# Patient Record
Sex: Female | Born: 1955 | Race: White | Hispanic: No | Marital: Single | Smoking: Former smoker
Health system: Southern US, Community
[De-identification: ages and names within clinical notes are randomized; demographics above are authoritative.]

## PROBLEM LIST (undated history)

## (undated) DIAGNOSIS — IMO0001 Reserved for inherently not codable concepts without codable children: Secondary | ICD-10-CM

## (undated) DIAGNOSIS — K759 Inflammatory liver disease, unspecified: Secondary | ICD-10-CM

## (undated) DIAGNOSIS — F32A Depression, unspecified: Secondary | ICD-10-CM

## (undated) DIAGNOSIS — M545 Low back pain, unspecified: Secondary | ICD-10-CM

## (undated) DIAGNOSIS — Z9889 Other specified postprocedural states: Secondary | ICD-10-CM

## (undated) DIAGNOSIS — J40 Bronchitis, not specified as acute or chronic: Secondary | ICD-10-CM

## (undated) DIAGNOSIS — M1990 Unspecified osteoarthritis, unspecified site: Secondary | ICD-10-CM

## (undated) DIAGNOSIS — I10 Essential (primary) hypertension: Secondary | ICD-10-CM

## (undated) DIAGNOSIS — T8859XA Other complications of anesthesia, initial encounter: Secondary | ICD-10-CM

## (undated) DIAGNOSIS — R112 Nausea with vomiting, unspecified: Secondary | ICD-10-CM

## (undated) DIAGNOSIS — K219 Gastro-esophageal reflux disease without esophagitis: Secondary | ICD-10-CM

## (undated) DIAGNOSIS — F329 Major depressive disorder, single episode, unspecified: Secondary | ICD-10-CM

## (undated) DIAGNOSIS — N289 Disorder of kidney and ureter, unspecified: Secondary | ICD-10-CM

## (undated) DIAGNOSIS — G8929 Other chronic pain: Secondary | ICD-10-CM

## (undated) DIAGNOSIS — H269 Unspecified cataract: Secondary | ICD-10-CM

## (undated) DIAGNOSIS — T4145XA Adverse effect of unspecified anesthetic, initial encounter: Secondary | ICD-10-CM

## (undated) DIAGNOSIS — L989 Disorder of the skin and subcutaneous tissue, unspecified: Secondary | ICD-10-CM

## (undated) HISTORY — PX: KNEE ARTHROSCOPY: SUR90

## (undated) HISTORY — PX: CARPAL TUNNEL RELEASE: SHX101

## (undated) HISTORY — PX: BACK SURGERY: SHX140

---

## 2001-10-06 ENCOUNTER — Encounter: Payer: Self-pay | Admitting: Cardiology

## 2004-03-24 ENCOUNTER — Ambulatory Visit: Payer: Self-pay | Admitting: Family Medicine

## 2004-06-23 ENCOUNTER — Ambulatory Visit: Payer: Self-pay | Admitting: Family Medicine

## 2005-12-14 ENCOUNTER — Ambulatory Visit: Payer: Self-pay | Admitting: Family Medicine

## 2006-07-04 ENCOUNTER — Ambulatory Visit: Payer: Self-pay | Admitting: Family Medicine

## 2006-11-06 ENCOUNTER — Encounter: Payer: Self-pay | Admitting: Cardiology

## 2008-06-18 ENCOUNTER — Ambulatory Visit: Payer: Self-pay | Admitting: Vascular Surgery

## 2008-07-01 ENCOUNTER — Encounter: Payer: Self-pay | Admitting: Cardiology

## 2008-07-02 ENCOUNTER — Encounter: Payer: Self-pay | Admitting: Cardiology

## 2008-09-17 ENCOUNTER — Ambulatory Visit: Payer: Self-pay | Admitting: Vascular Surgery

## 2008-09-30 ENCOUNTER — Encounter: Payer: Self-pay | Admitting: Cardiology

## 2008-10-16 ENCOUNTER — Encounter: Payer: Self-pay | Admitting: Physician Assistant

## 2008-10-16 ENCOUNTER — Ambulatory Visit: Payer: Self-pay | Admitting: Cardiology

## 2008-10-16 DIAGNOSIS — R079 Chest pain, unspecified: Secondary | ICD-10-CM | POA: Insufficient documentation

## 2008-10-21 ENCOUNTER — Ambulatory Visit: Payer: Self-pay | Admitting: Vascular Surgery

## 2008-10-28 ENCOUNTER — Ambulatory Visit: Payer: Self-pay | Admitting: Vascular Surgery

## 2008-11-04 ENCOUNTER — Ambulatory Visit: Payer: Self-pay | Admitting: Cardiology

## 2008-11-04 ENCOUNTER — Encounter: Payer: Self-pay | Admitting: Cardiology

## 2008-11-08 ENCOUNTER — Encounter (INDEPENDENT_AMBULATORY_CARE_PROVIDER_SITE_OTHER): Payer: Self-pay | Admitting: *Deleted

## 2008-12-03 ENCOUNTER — Encounter: Payer: Self-pay | Admitting: Cardiology

## 2008-12-03 DIAGNOSIS — F17200 Nicotine dependence, unspecified, uncomplicated: Secondary | ICD-10-CM | POA: Insufficient documentation

## 2008-12-03 DIAGNOSIS — R609 Edema, unspecified: Secondary | ICD-10-CM

## 2008-12-03 DIAGNOSIS — E663 Overweight: Secondary | ICD-10-CM | POA: Insufficient documentation

## 2008-12-03 DIAGNOSIS — E119 Type 2 diabetes mellitus without complications: Secondary | ICD-10-CM

## 2008-12-03 DIAGNOSIS — I10 Essential (primary) hypertension: Secondary | ICD-10-CM | POA: Insufficient documentation

## 2008-12-03 DIAGNOSIS — E785 Hyperlipidemia, unspecified: Secondary | ICD-10-CM

## 2008-12-04 ENCOUNTER — Ambulatory Visit: Payer: Self-pay | Admitting: Cardiology

## 2010-02-12 ENCOUNTER — Ambulatory Visit: Payer: Self-pay | Admitting: Internal Medicine

## 2010-02-12 ENCOUNTER — Ambulatory Visit (HOSPITAL_COMMUNITY)
Admission: RE | Admit: 2010-02-12 | Discharge: 2010-02-12 | Payer: Self-pay | Source: Home / Self Care | Attending: Internal Medicine | Admitting: Internal Medicine

## 2010-02-13 ENCOUNTER — Ambulatory Visit (HOSPITAL_COMMUNITY): Admission: RE | Admit: 2010-02-13 | Payer: Self-pay | Admitting: Internal Medicine

## 2010-04-17 ENCOUNTER — Other Ambulatory Visit: Payer: Self-pay | Admitting: Orthopedic Surgery

## 2010-04-17 DIAGNOSIS — M79605 Pain in left leg: Secondary | ICD-10-CM

## 2010-04-17 DIAGNOSIS — I82409 Acute embolism and thrombosis of unspecified deep veins of unspecified lower extremity: Secondary | ICD-10-CM

## 2010-04-20 ENCOUNTER — Ambulatory Visit
Admission: RE | Admit: 2010-04-20 | Discharge: 2010-04-20 | Disposition: A | Discharge status: Home / Self Care | Payer: No Typology Code available for payment source | Source: Ambulatory Visit | Attending: Orthopedic Surgery | Admitting: Orthopedic Surgery

## 2010-04-20 DIAGNOSIS — I82409 Acute embolism and thrombosis of unspecified deep veins of unspecified lower extremity: Secondary | ICD-10-CM

## 2010-04-20 DIAGNOSIS — M79605 Pain in left leg: Secondary | ICD-10-CM

## 2010-05-19 LAB — GLUCOSE, CAPILLARY: Glucose-Capillary: 181 mg/dL — ABNORMAL HIGH (ref 70–99)

## 2010-05-27 ENCOUNTER — Other Ambulatory Visit (HOSPITAL_BASED_OUTPATIENT_CLINIC_OR_DEPARTMENT_OTHER): Payer: Self-pay | Admitting: Orthopaedic Surgery

## 2010-05-27 ENCOUNTER — Encounter (HOSPITAL_COMMUNITY)
Admission: RE | Admit: 2010-05-27 | Discharge: 2010-05-27 | Disposition: A | Discharge status: Home / Self Care | Payer: Medicare Other | Source: Ambulatory Visit | Attending: Orthopaedic Surgery | Admitting: Orthopaedic Surgery

## 2010-05-27 ENCOUNTER — Ambulatory Visit (HOSPITAL_COMMUNITY)
Admission: RE | Admit: 2010-05-27 | Discharge: 2010-05-27 | Disposition: A | Discharge status: Home / Self Care | Payer: Medicare Other | Source: Ambulatory Visit | Attending: Orthopaedic Surgery | Admitting: Orthopaedic Surgery

## 2010-05-27 DIAGNOSIS — M48061 Spinal stenosis, lumbar region without neurogenic claudication: Secondary | ICD-10-CM

## 2010-05-27 DIAGNOSIS — Z01810 Encounter for preprocedural cardiovascular examination: Secondary | ICD-10-CM | POA: Insufficient documentation

## 2010-05-27 DIAGNOSIS — Z01818 Encounter for other preprocedural examination: Secondary | ICD-10-CM | POA: Insufficient documentation

## 2010-05-27 DIAGNOSIS — Z01812 Encounter for preprocedural laboratory examination: Secondary | ICD-10-CM | POA: Insufficient documentation

## 2010-05-27 LAB — DIFFERENTIAL
Basophils Absolute: 0.1 10*3/uL (ref 0.0–0.1)
Basophils Relative: 1 % (ref 0–1)
Eosinophils Relative: 4 % (ref 0–5)
Lymphocytes Relative: 30 % (ref 12–46)
Monocytes Absolute: 0.9 10*3/uL (ref 0.1–1.0)

## 2010-05-27 LAB — CBC
MCHC: 33.1 g/dL (ref 30.0–36.0)
Platelets: 484 10*3/uL — ABNORMAL HIGH (ref 150–400)
RDW: 13.6 % (ref 11.5–15.5)
WBC: 10.8 10*3/uL — ABNORMAL HIGH (ref 4.0–10.5)

## 2010-05-27 LAB — URINALYSIS, ROUTINE W REFLEX MICROSCOPIC
Nitrite: NEGATIVE
Protein, ur: NEGATIVE mg/dL
Specific Gravity, Urine: 1.018 (ref 1.005–1.030)
Urobilinogen, UA: 0.2 mg/dL (ref 0.0–1.0)

## 2010-05-27 LAB — SURGICAL PCR SCREEN
MRSA, PCR: POSITIVE — AB
Staphylococcus aureus: POSITIVE — AB

## 2010-05-27 LAB — COMPREHENSIVE METABOLIC PANEL
Alkaline Phosphatase: 66 U/L (ref 39–117)
BUN: 40 mg/dL — ABNORMAL HIGH (ref 6–23)
Creatinine, Ser: 1.5 mg/dL — ABNORMAL HIGH (ref 0.4–1.2)
Glucose, Bld: 128 mg/dL — ABNORMAL HIGH (ref 70–99)
Potassium: 5 mEq/L (ref 3.5–5.1)
Total Protein: 6.9 g/dL (ref 6.0–8.3)

## 2010-05-27 LAB — PROTIME-INR
INR: 0.91 (ref 0.00–1.49)
Prothrombin Time: 12.5 seconds (ref 11.6–15.2)

## 2010-05-29 ENCOUNTER — Inpatient Hospital Stay (HOSPITAL_COMMUNITY)
Admission: RE | Admit: 2010-05-29 | Discharge: 2010-05-30 | DRG: 491 | Disposition: A | Discharge status: Home / Self Care | Payer: Medicare Other | Source: Ambulatory Visit | Attending: Orthopaedic Surgery | Admitting: Orthopaedic Surgery

## 2010-05-29 ENCOUNTER — Ambulatory Visit (HOSPITAL_COMMUNITY): Payer: Medicare Other

## 2010-05-29 DIAGNOSIS — E669 Obesity, unspecified: Secondary | ICD-10-CM | POA: Diagnosis present

## 2010-05-29 DIAGNOSIS — Z01818 Encounter for other preprocedural examination: Secondary | ICD-10-CM

## 2010-05-29 DIAGNOSIS — M48062 Spinal stenosis, lumbar region with neurogenic claudication: Principal | ICD-10-CM | POA: Diagnosis present

## 2010-05-29 DIAGNOSIS — E119 Type 2 diabetes mellitus without complications: Secondary | ICD-10-CM | POA: Diagnosis present

## 2010-05-29 DIAGNOSIS — I10 Essential (primary) hypertension: Secondary | ICD-10-CM | POA: Diagnosis present

## 2010-05-29 DIAGNOSIS — Z8614 Personal history of Methicillin resistant Staphylococcus aureus infection: Secondary | ICD-10-CM

## 2010-05-29 DIAGNOSIS — Z01812 Encounter for preprocedural laboratory examination: Secondary | ICD-10-CM

## 2010-05-29 LAB — GLUCOSE, CAPILLARY
Glucose-Capillary: 152 mg/dL — ABNORMAL HIGH (ref 70–99)
Glucose-Capillary: 150 mg/dL — ABNORMAL HIGH (ref 70–99)

## 2010-05-30 LAB — BASIC METABOLIC PANEL
BUN: 22 mg/dL (ref 6–23)
Chloride: 101 mEq/L (ref 96–112)
Creatinine, Ser: 1.04 mg/dL (ref 0.4–1.2)
GFR calc non Af Amer: 55 mL/min — ABNORMAL LOW (ref >60)
Glucose, Bld: 184 mg/dL — ABNORMAL HIGH (ref 70–99)
Potassium: 4.6 mEq/L (ref 3.5–5.1)

## 2010-05-30 LAB — GLUCOSE, CAPILLARY: Glucose-Capillary: 215 mg/dL — ABNORMAL HIGH (ref 70–99)

## 2010-06-02 NOTE — Op Note (Signed)
[name] [name], [name] [name]                 ACCOUNT NO.:  [id number]  MEDICAL RECORD NO.:  [id number]           PATIENT TYPE:  I  LOCATION:  5030                         FACILITY:  [facility]  PHYSICIAN:  Cristle Jared C. Ophelia Charter, M.D.    DATE OF BIRTH:  16-Feb-1956  DATE OF PROCEDURE:  05/29/2010 DATE OF DISCHARGE:                              OPERATIVE REPORT   PREOPERATIVE DIAGNOSIS:  L4-L5 spinal stenosis with neurogenic claudication.  POSTOPERATIVE DIAGNOSIS:  L4-L5 spinal stenosis with neurogenic claudication.  PROCEDURE:  L4-L5 decompression.  SURGEON:  Jazzmon Prindle C. Ophelia Charter, MD  ASSISTANT:  Maud Deed, PA-C  ANESTHESIA:  GOT plus Marcaine skin local.  DESCRIPTION OF PROCEDURE:  After induction of general anesthesia and orotracheal intubation, this patient has a very large frame and it was 118 kg, adjustment of the pads had been made which the patient flattened out which were chest rolls to get everything padded and positioned properly.  Gowns were used since she had a positive MRSA and positive MSSA PCR swab preoperatively.  Vancomycin was given preoperatively for prophylaxis.  Shoulder pads and elbow pads over the ulnar nerve with yellow foam crates were applied.  Pads underneath the knees and feet with pillows.  Back was prepped with ChloraPrep, allowed to dry, and then squared with towels, Betadine and Steri drape applied.  Laminectomy machine draped.  Needle localization showed needle was just adjacent in the L4-L5 level and appropriate incision was made.  Once subperiosteal dissection down on the spinous process, the extra-extra deep retractors had to be used due to the thick layer of adipose tissue.  A Kocher clamp was placed down the spinous process and another x-ray was taken due to the large size of the patient.  There was very narrow gap between the two facets and a fingertip would barely fit between the base of the spinous process and the facet joint due to the extremely short  lamina. X-ray confirmed that I was directly over the disk.  Spinous process was resected and lamina was trimmed to bend.  Power bur was used for thinning.  The top portion of lamina was left since the patient had very fairly narrow stenotic segment exactly at the level of the disk.  There were thick chunks of disk that the patient has had epidural injections and there was the scar tissue present.  Operative microscope was being used, had been draped, and dura was gently freed up with either the dural separator or ball-tipped blunt nerve hook.  Patties were placed and then thick chunks of ligament were removed.  There was significantly more hypertrophic ligamentum and there were overhanging spurs.  Foramen was enlarged on both sides.  Ligamentum was adherent to the dura at the top portion of the L5 lamina.  Top edge of spinous process was thinned with Kerrison rongeur, some with the bur and as microdissection, the dural forceps were being pulled up on a piece of ligamentum that was adherent to the dura, there was some spinal fluid noted.  Spinous process and lamina was taken for exposure.  This ligament got going around with no areas peeled off  and there were 2 tiny pinpoint holes where the ligamentum had been adherent.  Patties were placed and 6-0 Prolene was used to make a single pass.  The hole in the dura was almost the same size as the suture and a single suture in each old gave a watertight seal.  Some blue DuraSeal was placed over the top.  The patient Valsalva'd with a 35-cm water pressure with no CSF leak.  There were large epidural veins in the gutter, particularly on the right side. Bipolar cautery was used as well as thrombin-soaked Gelfoam placed in the gutter.  Dissection out below the pedicle so the dura was rounded up, no longer had that tight short band of constriction.  Operative field was dry.  There was no epidural bleeding.  Field was dry.  Dural repair looked good and  standard layered closure with 0-Vicryl in the fascia and 2-0 Vicryl subcutaneous tissue subcuticular closure.  The patient tolerated the procedure well and was transferred to the recovery room in stable condition.     Goble Fudala C. Ophelia Charter, M.D.     MCY/MEDQ  D:  05/29/2010  T:  05/30/2010  Job:  [id number]  Electronically Signed by Annell Greening M.D. on 06/02/2010 06:01:48 PM

## 2010-07-06 NOTE — Discharge Summary (Signed)
  [name], [name]                 ACCOUNT NO.:  [id number]  MEDICAL RECORD NO.:  [id number]           PATIENT TYPE:  I  LOCATION:  5030                         FACILITY:  [facility]  PHYSICIAN:  Mark C. Ophelia Charter, M.D.    DATE OF BIRTH:  [birth year]  DATE OF ADMISSION:  05/29/2010 DATE OF DISCHARGE:  05/30/2010                              DISCHARGE SUMMARY   ADMISSION DIAGNOSIS:  L4-5 spinal stenosis with neurogenic claudication.  DISCHARGE DIAGNOSIS:  L4-5 spinal stenosis with neurogenic claudication.  PROCEDURE:  L4-5 decompression on May 29, 2010, performed by Dr. Ophelia Charter, assisted by Maud Deed PA-C under general anesthesia.  CONSULTATIONS:  None.  BRIEF HISTORY:  The patient was admitted for overnight observation to undergo L4-5 decompression.  She tolerated the procedure under general anesthesia without complications.  Postoperatively, neurovascular motor function was intact in the lower extremities and she had good relief of her lower extremity discomfort.  Due to a small dural tear at the L4-5 level, she remained at bedrest for approximately 12 hours postoperatively and then was started on physical therapy.  She was able to tolerate ambulation and gait training without headache or nausea or vomiting.  She was able to be discharged to her home on the following day in stable condition.  At that time, she was eating well and voiding. The patient was comfortable on oral analgesics and she was afebrile and vital signs were stable.  PLAN:  The patient was advised to keep her dressing dry and clean for a total of 5 days and then she may shower if there is no wound drainage. She will change her dressing daily or as needed.  She will continue to ambulate as tolerated.  She may use ice packs to her low back.  She will follow up with Dr. Ophelia Charter in 1 week.  The patient will continue on her home medications as taken prior to admission.  She was given a prescription for Percocet 5/325,  one to two every 4-6 hours as needed for pain to use at home.  All questions encouraged and answered.  CONDITION ON DISCHARGE:  Stable.     Wende Neighbors, P.A.   ______________________________ Veverly Fells Ophelia Charter, M.D.    SMV/MEDQ  D:  06/25/2010  T:  06/26/2010  Job:  [id number]  cc:   Veverly Fells. Ophelia Charter, M.D.  Electronically Signed by Dorna Mai. on 06/26/2010 12:47:46 PM Electronically Signed by Annell Greening M.D. on 07/06/2010 04:11:45 PM

## 2010-07-21 NOTE — Assessment & Plan Note (Signed)
OFFICE VISIT   MARDENE, LESSIG  DOB:  1955-04-16                                       09/17/2008  [id number]#:[id number]   The patient returns today for further followup regarding her severe  venous insufficiency of the right leg.  She has swelling and pain in the  right leg with severe gross reflux in the entire right great saphenous  vein up to the saphenofemoral junction.  She has developed  hyperpigmentation in the lower third of the right leg particularly on  the lateral aspect with increasing discomfort.  She has tried  conservative measures over the past 3 months.  Because of the size of  her thigh it is very difficult for long leg elastic compression  stockings to stay in place.  She has tried this but has been  unsuccessful.  She has also tried elevation and analgesics with no  improvement.   On exam today there is no change in the right lower extremity with  obvious chronic venous insufficiency, swelling, hyperpigmentation and  needs laser ablation of her right great saphenous vein to prevent  further skin changes and complications.  We will proceed with  precertification for this to perform it in the near future.   Quita Skye Hart [place], M.D.  Electronically Signed   JDL/MEDQ  D:  09/17/2008  T:  09/18/2008  Job:  [id number]   cc:   Delaney Meigs, M.D.

## 2010-07-21 NOTE — Procedures (Signed)
DUPLEX DEEP VENOUS EXAM - LOWER EXTREMITY   INDICATION:  Follow up right greater saphenous vein ablation.   HISTORY:  Edema:  Trauma/Surgery:  10/21/08, right greater saphenous vein ablation  Pain:  Better today (right lower extremity)  PE:  No  Previous DVT:  No  Anticoagulants:  No  Other:   DUPLEX EXAM:                CFV   SFV   PopV   PTV   GSV                R  L  R  L  R  L   R  L  R  L  Thrombosis    o  o  o     o      o     +  Spontaneous   +  +  +     +      +     0  Phasic        +  +  +     +      +     0  Augmentation  +  +  +     +      +     0  Compressible  +  +  +     +      +     0  Competent     D  D  +     +      +     0   Legend:  + - yes  o - no  p - partial  D - decreased   IMPRESSION:  1. No evidence of deep venous thrombosis in the right lower extremity      deep or left common femoral vein.  2. Evidence of right greater saphenous vein ablation without flow from      knee to saphenofemoral junction without extending into the common      femoral vein.  3. Evidence of thrombus noted in the right thigh varicosities.         _____________________________  Quita Skye Hart [place], M.D.   AS/MEDQ  D:  10/28/2008  T:  10/28/2008  Job:  [id number]

## 2010-07-21 NOTE — Assessment & Plan Note (Signed)
OFFICE VISIT   SHERRI, MCARTHY  DOB:  11-13-1955                                       10/28/2008  [id number]#:[id number]   The patient underwent laser ablation of her right great saphenous vein  from mid calf to the saphenofemoral junction 1 week ago.  She has had  some mild discomfort along the course of the saphenous vein as one would  expect but states that that has rapidly improved.  She has already  noticed that she is having less edema in the right ankle and foot area.  She was unable to wear her long-leg elastic compression stocking  effectively because of the size of her thigh.   On physical exam today she continues to have some hyperpigmentation in  the lower third of the leg with diminished edema from previous exam.  She has excellent dorsalis pedis and posterior tibial pulse at 3+ and  minimal tenderness along the course of the great saphenous vein ablation  site.   We will fit her for some short-leg elastic compression stockings to wear  on a daily basis.  Her venous duplex exam revealed no evidence of deep  venous obstruction with closure of the right great saphenous vein from  the mid calf to the saphenofemoral junction.  She will return to see [place]  on a p.r.n. basis.   Quita Skye Hart [place], M.D.  Electronically Signed   JDL/MEDQ  D:  10/28/2008  T:  10/29/2008  Job:  [id number]   cc:   Delaney Meigs, M.D.

## 2010-07-21 NOTE — Consult Note (Signed)
NEW PATIENT CONSULTATION   [name] [name], [name] [name]  DOB:  1955-11-12                                       06/18/2008  ZOXWR#:[id number]   The patient is a 55 year old female referred by Dr. Ulice Brilliant for edema and  pain in the right lower extremity with skin changes.  This patient had  treatment recently for a heel spur by Dr. Ulice Brilliant and was placed in a  boot postoperatively which severely aggravated her right leg.  She has  been having chronic pain and some early skin changes in the lower  lateral aspect of the right leg and this aggravated it, increased the  redness and pain.  The boot was discontinued after 1 day but she has  continued to have severe edema in the right leg worse than the left.  She has had two courses of antibiotics including cephalexin and Cipro  with some mild improvement but continued pain and erythema in the right  leg.  She has no history of deep venous thrombosis, thrombophlebitis,  pulmonary emboli, bleeding or stasis ulcers.   PAST MEDICAL HISTORY:  1. Non-insulin-dependent diabetes mellitus.  2. Hypertension.  3. Hyperlipidemia.  4. Asthma.  5. Degenerative disk disease.  6. Heel spur.  7. Negative for coronary artery disease, COPD or stroke.   PAST SURGICAL HISTORY:  1. Carpal tunnel bilaterally.  2. C-section.  3. Right knee arthroscopy.   FAMILY HISTORY:  Positive for coronary artery disease and stroke in her  father.  Negative for diabetes.   SOCIAL HISTORY:  She is single, works as a Solicitor, has one child, has not  smoked since 1988 and does not use alcohol.   REVIEW OF SYSTEMS:  Has had significant weight gain, has dyspnea on  exertion, palpitations, asthma, wheezing, reflux esophagitis, pain in  the lower legs with walking, arthritis, joint pain, muscle pain,  depression.   ALLERGIES:  Tetracycline.   MEDICATIONS:  Please see health history form.   PHYSICAL EXAM:  Vital signs:  Blood pressure 166/88, heart rate is 80,  respirations are 14.  General:  She is an obese middle-aged female in no  apparent distress, alert and oriented x3.  Neck:  Is supple, 3+ carotid  pulse palpable.  No bruits are audible.  Neurological:  Normal.  No  palpable adenopathy in the neck.  Chest:  Clear to auscultation.  Cardiovascular:  Regular rhythm.  No murmurs.  Abdomen:  Obese.  No  palpable masses.  She has 3+ femoral, popliteal and dorsalis pedis  pulses bilaterally.  Both legs have distal edema right worse than the  left.  Right leg has some hyperpigmentation developing on the lateral  aspect of the lower third of the leg with tenderness to palpation in an  area about 8 cm in diameter above the lateral malleolus.  No ulcerations  are noted.  No varicosities or spider veins are noted on physical exam.   Venous duplex exam revealed deep venous system to have mild reflux on  the right but the great saphenous system to have gross reflux including  the saphenofemoral junction and throughout the great saphenous vein to  the knee.   I feel that her skin changes and pain are secondary to the reflux seen  in great saphenous vein with venous hypertension.  We will treat her  initially with long leg elastic  compression stockings (20 mm - 30 mm  gradient) as well as elevation and ibuprofen on a regular basis.  She  will return in 3 months and if there has been no improvement I think  laser ablation of the right great saphenous system would be indicated  for her skin changes and symptomatology.   Quita Skye Hart [place], M.D.  Electronically Signed   JDL/MEDQ  D:  06/18/2008  T:  06/19/2008  Job:  [id number]   cc:   Denny Peon. Ulice Brilliant, D.P.M.  Tillman Abide, NP

## 2010-07-21 NOTE — Procedures (Signed)
LOWER EXTREMITY VENOUS REFLUX EXAM   INDICATION:  Right lower extremity pain and swelling with varicose  veins.   EXAM:  Using color-flow imaging and pulse Doppler spectral analysis, the  right common femoral, superficial femoral, popliteal, posterior tibial,  greater and lesser saphenous veins are evaluated.  There is evidence  suggesting deep venous insufficiency in the right lower extremity.   The right saphenofemoral junction is not competent.  The right GSV is  not competent with the caliber as described below.   The right proximal short saphenous vein demonstrates competency.   GSV Diameter (used if found to be incompetent only)                                            Right    Left  Proximal Greater Saphenous Vein           0.92 cm  cm  Proximal-to-mid-thigh                     0.92 cm  cm  Mid thigh                                 0.88 cm  cm  Mid-distal thigh                          0.88 cm  cm  Distal thigh                              0.67 cm  cm  Knee                                      0.50 cm  cm   IMPRESSION:  1. Right greater saphenous vein reflux is identified with the caliber      ranging from 0.50 cm to 1.35 cm knee to groin.  2. The right greater saphenous vein is not aneurysmal.  3. The right greater saphenous vein is not tortuous.  4. The deep venous system is not competent.  5. The right lesser saphenous vein is competent.  6. No evidence of deep venous thrombosis noted in the right leg.       ___________________________________________  Quita Skye. Hart [place], M.D.   MG/MEDQ  D:  06/18/2008  T:  06/18/2008  Job:  [id number]

## 2010-07-21 NOTE — Assessment & Plan Note (Signed)
[facility]                          [facility] OFFICE NOTE   NAME:[name] [name], [name] [name]                        MRN:          [id number]  DATE:10/16/2008                            DOB:          1955/09/20    REFERRING PHYSICIAN:  Samuel Jester, MD   REASON FOR CONSULTATION:  [name] [name] is a very pleasant 55 year old  female, with numerous cardiac risk factors, but no documented history of  coronary artery disease.  She is now referred to Dr. Willa Rough for  evaluation of chest pain.   [name] [name] presents with cardiac risk factors notable for type 2 diabetes  mellitus, hypertension, dyslipidemia, and family history of coronary  artery disease.  The patient was previously studied in 2003, in our  [facility], with an adenosine stress Cardiolite for risk  stratification.  This was essentially a normal perfusion study, with  calculated ejection fraction of 71%.   [name] [name] presents with a history of intermittent chest pain over the  past 6-7 months.  The pain is described as sharp, sudden in onset,  always occurring at rest, and typically at night while she is asleep.  Her last episode occurred 1 week ago, again awakening her from sleep,  with extension down into the left upper extremity, but not into the jaw.  She reported some associated shortness of breath.  She took one  nitroglycerin, with prompt relief, and has not had any recurrent  symptoms.   Also of note, [name] [name] denies any history of exertional angina  pectoris.  She does note some exertional dyspnea, however.   The patient also reports a history of reflux disease, but states that  these symptoms are dissimilar.   [name] [name] is currently being evaluated by Dr. Josephina Gip in  [place], and is scheduled to undergo laser surgery of varicose veins  in the right lower extremity, this coming Monday, August 16.   Electrocardiogram in our office today indicates sinus tachycardia at  102  bpm with normal axis and nonspecific ST changes.   ALLERGIES:  TETRACYCLINE.  Intolerant to multiple STATINS, including  PRAVACHOL, LIPITOR, VYTORIN, CRESTOR, as well as TRICOR.   CURRENT MEDICATIONS:  1. Aspirin 81 daily.  2. Lisinopril 20/25 mg daily.  3. Metformin 500 mg b.i.d.  4. Furosemide 40 mg daily.  5. Claritin 10 mg daily.  6. Diclofenac 75 mg b.i.d.  7. Microgestin Fe daily.  8. Fish oil 2400 mg b.i.d.  9. Red yeast rice b.i.d.  10.Glucosamine b.i.d.   PAST MEDICAL HISTORY:  1. Type 2 diabetes mellitus.  2. Hypertension.  3. Dyslipidemia.  4. GERD.   SURGICAL HISTORY:  C-section.   REVIEW OF SYSTEMS:  Denies tachy palpitations, PND, orthopnea, but notes  chronic lower extremity edema with intermittent discomfort of the right  lower extremities.  Occasional reflux symptoms, as previously noted.  All other remaining systems reviewed, and are negative.   PHYSICAL EXAMINATION:  VITAL SIGNS:  Blood pressure currently 130/90,  pulse 102, and weight 264.8.  GENERAL:  A 55 year old female, morbidly obese, sitting upright, no  distress.  HEENT:  Normocephalic and atraumatic.  PERRLA.  EOMI.  NECK:  Palpable carotid pulses without bruits; unable to assess JVD,  secondary to neck girth.  LUNGS:  Clear to auscultation in all fields.  HEART:  Regular rate and rhythm.  No significant murmurs.  No rubs.  ABDOMEN:  Protuberant, intact bowel sounds.  EXTREMITIES:  Palpable bilateral femoral pulses without bruits; brisk  bilateral dorsalis pedis pulses.  1+ pedal edema.  Mild erythema on the  anterior aspect of the right lower extremity.  SKIN:  Warm and dry.  MUSCULOSKELETAL:  Gross abnormality.  NEUROLOGIC:  Alert and oriented.   RECENT LABS:  Total cholesterol 253, triglycerides 235, HDL 42, and LDL  164, July 2007.  Normal renal function, CBC, and TSH.  Hemoglobin A1c is  7.3.   IMPRESSION:  1. Atypical chest pain.      a.     Relieved by nitroglycerin.       b.     Question possible esophageal vasospasm.      c.     Normal adenosine Cardiolite in 2003.  2. Multiple cardiac risk factors.      a.     Type 2 diabetes mellitus.      b.     Hypertension.      c.     Dyslipidemia.      d.     Family history.      e.     History of tobacco.  3. Gastroesophageal reflux disease  4. Lower extremity edema.  5. Morbid obesity.   PLAN:  Following review with Dr. Willa Rough, recommendation is as  follows:  Proceed with extensive noninvasive workup consisting of a  baseline 2-D echocardiogram, for assessment of left ventricular function  and rule out of any underlying structural abnormalities, as well as a  dobutamine stress echocardiogram for risk stratification.   The patient presents with atypical chest pain, but does report relief  with nitroglycerin and presents in the context of numerous cardiac risk  factors.  Electrocardiogram does not indicate any ischemic changes.  Therefore, plan is to proceed with a noninvasive approach, but we will  certainly maintain a low threshold for considering a diagnostic cardiac  catheterization, in the event there is any suggestion of ischemia.   We will arrange for the patient to return for follow up with myself and  Dr. Myrtis Ser in 1 month, for review of study results and further  recommendations.  The patient will be provided with a prescription for  nitroglycerin.      Rozell Searing, PA-C  Electronically Signed      Luis Abed, MD, [facility]  Electronically Signed   GS/MedQ  DD: 10/16/2008  DT: 10/17/2008  Job #: [id number]   cc:   Lalla Brothers. Hart [place], M.D.

## 2011-01-02 ENCOUNTER — Encounter: Payer: Self-pay | Admitting: *Deleted

## 2011-01-02 ENCOUNTER — Emergency Department (HOSPITAL_COMMUNITY)
Admission: EM | Admit: 2011-01-02 | Discharge: 2011-01-02 | Disposition: A | Discharge status: Home / Self Care | Payer: Medicare Other | Attending: Emergency Medicine | Admitting: Emergency Medicine

## 2011-01-02 DIAGNOSIS — E119 Type 2 diabetes mellitus without complications: Secondary | ICD-10-CM | POA: Insufficient documentation

## 2011-01-02 DIAGNOSIS — Z87891 Personal history of nicotine dependence: Secondary | ICD-10-CM | POA: Insufficient documentation

## 2011-01-02 DIAGNOSIS — I10 Essential (primary) hypertension: Secondary | ICD-10-CM | POA: Insufficient documentation

## 2011-01-02 DIAGNOSIS — S39012A Strain of muscle, fascia and tendon of lower back, initial encounter: Secondary | ICD-10-CM

## 2011-01-02 DIAGNOSIS — S335XXA Sprain of ligaments of lumbar spine, initial encounter: Secondary | ICD-10-CM | POA: Insufficient documentation

## 2011-01-02 DIAGNOSIS — X58XXXA Exposure to other specified factors, initial encounter: Secondary | ICD-10-CM | POA: Insufficient documentation

## 2011-01-02 DIAGNOSIS — J45909 Unspecified asthma, uncomplicated: Secondary | ICD-10-CM | POA: Insufficient documentation

## 2011-01-02 DIAGNOSIS — M109 Gout, unspecified: Secondary | ICD-10-CM | POA: Insufficient documentation

## 2011-01-02 HISTORY — DX: Disorder of kidney and ureter, unspecified: N28.9

## 2011-01-02 HISTORY — DX: Essential (primary) hypertension: I10

## 2011-01-02 LAB — URINALYSIS, ROUTINE W REFLEX MICROSCOPIC
Glucose, UA: 100 mg/dL — AB
Hgb urine dipstick: NEGATIVE
Ketones, ur: NEGATIVE mg/dL
Protein, ur: NEGATIVE mg/dL

## 2011-01-02 LAB — BASIC METABOLIC PANEL
BUN: 41 mg/dL — ABNORMAL HIGH (ref 6–23)
CO2: 30 mEq/L (ref 19–32)
Calcium: 9 mg/dL (ref 8.4–10.5)
Creatinine, Ser: 1.57 mg/dL — ABNORMAL HIGH (ref 0.50–1.10)
Glucose, Bld: 154 mg/dL — ABNORMAL HIGH (ref 70–99)

## 2011-01-02 MED ORDER — PREDNISONE 10 MG PO TABS: ORAL_TABLET | ORAL | Status: DC

## 2011-01-02 MED ORDER — OXYCODONE-ACETAMINOPHEN 5-325 MG PO TABS
1.0000 | ORAL_TABLET | Freq: Once | ORAL | Status: AC
  Administered 2011-01-02: 1 via ORAL
  Filled 2011-01-02: qty 1

## 2011-01-02 MED ORDER — OXYCODONE-ACETAMINOPHEN 5-325 MG PO TABS: 1.0000 | ORAL_TABLET | ORAL | Status: AC | PRN

## 2011-01-02 NOTE — ED Notes (Signed)
Pt c/o back pain and left foot pain. Pt states foot pain is d/t gout.

## 2011-01-02 NOTE — ED Provider Notes (Signed)
History     CSN: [id number] Arrival date & time: 01/02/2011  4:45 PM   First MD Initiated Contact with Patient 01/02/11 1651      Chief Complaint  Patient presents with  . Back Pain  . Foot Pain    gout    (Consider location/radiation/quality/duration/timing/severity/associated sxs/prior treatment) Patient is a 55 y.o. female presenting with back pain and lower extremity pain. The history is provided by the patient.  Back Pain  This is a new problem. The current episode started 2 days ago. The problem occurs constantly. The problem has not changed since onset.The pain is associated with no known injury. Pain location: right paralumbar spine. The quality of the pain is described as aching and cramping. The pain does not radiate. The pain is at a severity of 9/10. The pain is moderate. The symptoms are aggravated by bending, twisting and certain positions (Pain is relieved by rest). Pertinent negatives include no chest pain, no fever, no numbness, no headaches, no abdominal pain, no abdominal swelling, no bowel incontinence, no bladder incontinence, no dysuria, no pelvic pain, no leg pain, no paresthesias, no paresis, no tingling and no weakness. Associated symptoms comments: SHe also complains of a flare up of her gout in her left dorsal foot which started 3-4 days ago.  She currently takes uloric which does not adequately control her symptoms.  She has taken hydrocodone last night which was not strong enough to relieve this pain.. She has tried muscle relaxants and analgesics (Her pcp called her in flexeril yesterday,  which is not relieving her back pain.) for the symptoms. The treatment provided no relief. Risk factors: she denies injury.  She does have a history of renal disease and is concerned her back pain may be kidney related.  No history of kidney stones.  Foot Pain Associated symptoms include arthralgias and joint swelling. Pertinent negatives include no abdominal pain, chest pain,  congestion, fever, headaches, nausea, neck pain, numbness, rash, sore throat or weakness.    Past Medical History  Diagnosis Date  . Gout   . Hypertension   . Diabetes mellitus   . Renal disorder   . Asthma     Past Surgical History  Procedure Date  . Cesarean section     History reviewed. No pertinent family history.  History  Substance Use Topics  . Smoking status: Former Games developer  . Smokeless tobacco: Not on file  . Alcohol Use: 0.6 oz/week    1 Cans of beer per week     daily    OB History    Grav Para Term Preterm Abortions TAB SAB Ect Mult Living                  Review of Systems  Constitutional: Negative for fever.  HENT: Negative for congestion, sore throat and neck pain.   Eyes: Negative.   Respiratory: Negative for chest tightness and shortness of breath.   Cardiovascular: Negative for chest pain.  Gastrointestinal: Negative for nausea, abdominal pain and bowel incontinence.  Genitourinary: Positive for flank pain. Negative for bladder incontinence, dysuria, urgency, hematuria, decreased urine volume and pelvic pain.  Musculoskeletal: Positive for back pain, joint swelling and arthralgias.  Skin: Negative.  Negative for rash and wound.  Neurological: Negative for dizziness, tingling, weakness, light-headedness, numbness, headaches and paresthesias.  Hematological: Negative.   Psychiatric/Behavioral: Negative.     Allergies  Tetracycline  Home Medications  No current outpatient prescriptions on file.  BP 130/56  Pulse 113  Temp(Src) 98.5 F (36.9 C) (Oral)  Resp 22  Ht 5\' 1"  (1.549 m)  Wt 250 lb (113.399 kg)  BMI 47.24 kg/m2  SpO2 98%  Physical Exam  Nursing note and vitals reviewed. Constitutional: She is oriented to person, place, and time. She appears well-developed and well-nourished.  HENT:  Head: Normocephalic.  Eyes: Conjunctivae are normal.  Neck: Normal range of motion. Neck supple.  Cardiovascular: Regular rhythm and intact  distal pulses.        Pedal pulses normal.  Pulmonary/Chest: Effort normal. She has no wheezes.  Abdominal: Soft. Bowel sounds are normal. She exhibits no distension and no mass.  Musculoskeletal: Normal range of motion. She exhibits tenderness. She exhibits no edema.       Lumbar back: She exhibits tenderness. She exhibits no swelling, no edema and no spasm.       Left foot: She exhibits tenderness. She exhibits no swelling and no deformity.       Tender right flank and paralumbar area.  Not worsened with palpation,  Worsened with forward flexion.  TTP left dorsal foot,  No erythema or increased calor.  No significant swelling,  Comparable to right foot.  Neurological: She is alert and oriented to person, place, and time. She has normal strength. She displays no atrophy and no tremor. No cranial nerve deficit or sensory deficit. Gait normal.  Reflex Scores:      Patellar reflexes are 2+ on the right side and 2+ on the left side.      Achilles reflexes are 2+ on the right side and 2+ on the left side.      No strength deficit noted in hip and knee flexor and extensor muscle groups.  Ankle flexion and extension intact.  Skin: Skin is warm and dry.  Psychiatric: She has a normal mood and affect.    ED Course  Procedures (including critical care time)   Labs Reviewed  URINALYSIS, ROUTINE W REFLEX MICROSCOPIC  BASIC METABOLIC PANEL   No results found.   No diagnosis found.    MDM  Lumbar strain - relieved completely by hydrocodone given in ed.  Gout of left foot.  Prednisone prescribed as pt reports this is the only med that helps - allopurinol,  Uloric,  Colchicine does not relieve her gout pain.  Advised to watch her blood glucose closely while on prednisone.        Candis Musa, PA 01/02/11 1927

## 2011-01-02 NOTE — ED Provider Notes (Signed)
Medical screening examination/treatment/procedure(s) were conducted as a shared visit with non-physician practitioner(s) and myself.  I personally evaluated the patient during the encounter  Flint Melter, MD 01/02/11 2316

## 2011-01-02 NOTE — ED Notes (Signed)
Pt a/ox4. Resp even and unlabored. NAD at this time. D/C instruction reviewed with pt along with rx x2. Pt verbalized understanding. Pt ambulated to POV with steady gate. Pt with sister to transport home.

## 2011-05-17 ENCOUNTER — Ambulatory Visit: Payer: Medicare Other | Attending: Orthopaedic Surgery | Admitting: Physical Therapy

## 2011-05-17 DIAGNOSIS — R5381 Other malaise: Secondary | ICD-10-CM | POA: Insufficient documentation

## 2011-05-17 DIAGNOSIS — M545 Low back pain, unspecified: Secondary | ICD-10-CM | POA: Insufficient documentation

## 2011-05-17 DIAGNOSIS — IMO0001 Reserved for inherently not codable concepts without codable children: Secondary | ICD-10-CM | POA: Insufficient documentation

## 2011-05-17 DIAGNOSIS — R293 Abnormal posture: Secondary | ICD-10-CM | POA: Insufficient documentation

## 2011-05-19 ENCOUNTER — Ambulatory Visit: Payer: Medicare Other | Admitting: Physical Therapy

## 2011-05-24 ENCOUNTER — Ambulatory Visit: Payer: Medicare Other | Admitting: Physical Therapy

## 2011-05-26 ENCOUNTER — Ambulatory Visit: Payer: Medicare Other | Admitting: Physical Therapy

## 2011-05-31 ENCOUNTER — Ambulatory Visit: Payer: Medicare Other | Admitting: Physical Therapy

## 2011-06-02 ENCOUNTER — Ambulatory Visit: Payer: Medicare Other | Admitting: Physical Therapy

## 2011-06-07 ENCOUNTER — Ambulatory Visit: Payer: Medicare Other | Attending: Orthopaedic Surgery | Admitting: Physical Therapy

## 2011-06-07 DIAGNOSIS — IMO0001 Reserved for inherently not codable concepts without codable children: Secondary | ICD-10-CM | POA: Insufficient documentation

## 2011-06-07 DIAGNOSIS — R293 Abnormal posture: Secondary | ICD-10-CM | POA: Insufficient documentation

## 2011-06-07 DIAGNOSIS — M545 Low back pain, unspecified: Secondary | ICD-10-CM | POA: Insufficient documentation

## 2011-06-07 DIAGNOSIS — R5381 Other malaise: Secondary | ICD-10-CM | POA: Insufficient documentation

## 2011-06-09 ENCOUNTER — Ambulatory Visit: Payer: Medicare Other | Admitting: Physical Therapy

## 2011-06-14 ENCOUNTER — Ambulatory Visit: Payer: Medicare Other | Admitting: Physical Therapy

## 2011-06-16 ENCOUNTER — Ambulatory Visit: Payer: Medicare Other | Admitting: Physical Therapy

## 2011-06-21 ENCOUNTER — Encounter: Payer: No Typology Code available for payment source | Admitting: Physical Therapy

## 2011-06-23 ENCOUNTER — Ambulatory Visit: Payer: Medicare Other | Admitting: Physical Therapy

## 2011-06-29 ENCOUNTER — Ambulatory Visit: Payer: Medicare Other | Admitting: Physical Therapy

## 2011-08-10 ENCOUNTER — Encounter (HOSPITAL_COMMUNITY): Payer: Self-pay | Admitting: Pharmacy Technician

## 2011-08-11 ENCOUNTER — Encounter (HOSPITAL_COMMUNITY)
Admission: RE | Admit: 2011-08-11 | Discharge: 2011-08-11 | Disposition: A | Discharge status: Home / Self Care | Payer: Medicare Other | Source: Ambulatory Visit | Attending: Surgery | Admitting: Surgery

## 2011-08-11 ENCOUNTER — Encounter (HOSPITAL_COMMUNITY)
Admission: RE | Admit: 2011-08-11 | Discharge: 2011-08-11 | Disposition: A | Discharge status: Home / Self Care | Payer: Medicare Other | Source: Ambulatory Visit | Attending: Orthopedic Surgery | Admitting: Orthopedic Surgery

## 2011-08-11 LAB — TYPE AND SCREEN: Antibody Screen: NEGATIVE

## 2011-08-11 LAB — PROTIME-INR
INR: 0.89 (ref 0.00–1.49)
Prothrombin Time: 12.2 seconds (ref 11.6–15.2)

## 2011-08-11 LAB — URINALYSIS, ROUTINE W REFLEX MICROSCOPIC
Bilirubin Urine: NEGATIVE
Hgb urine dipstick: NEGATIVE
Ketones, ur: NEGATIVE mg/dL
Nitrite: NEGATIVE
Specific Gravity, Urine: 1.016 (ref 1.005–1.030)
Urobilinogen, UA: 0.2 mg/dL (ref 0.0–1.0)
pH: 5 (ref 5.0–8.0)

## 2011-08-11 LAB — CBC
HCT: 37.2 % (ref 36.0–46.0)
Hemoglobin: 12 g/dL (ref 12.0–15.0)
MCH: 29.5 pg (ref 26.0–34.0)
MCHC: 32.3 g/dL (ref 30.0–36.0)
MCV: 91.4 fL (ref 78.0–100.0)
RDW: 14.2 % (ref 11.5–15.5)

## 2011-08-11 LAB — COMPREHENSIVE METABOLIC PANEL
Albumin: 4 g/dL (ref 3.5–5.2)
Alkaline Phosphatase: 75 U/L (ref 39–117)
BUN: 77 mg/dL — ABNORMAL HIGH (ref 6–23)
Creatinine, Ser: 2.26 mg/dL — ABNORMAL HIGH (ref 0.50–1.10)
GFR calc Af Amer: 27 mL/min — ABNORMAL LOW (ref >90)
Glucose, Bld: 81 mg/dL (ref 70–99)
Total Protein: 7.6 g/dL (ref 6.0–8.3)

## 2011-08-11 LAB — SURGICAL PCR SCREEN: Staphylococcus aureus: NEGATIVE

## 2011-08-11 LAB — ABO/RH: ABO/RH(D): A POS

## 2011-08-11 NOTE — Pre-Procedure Instructions (Signed)
20 [name] [name]  08/11/2011   Your procedure is scheduled on:  Wednesday August 18, 2011.  Report to [facility] at  0800 AM.  Call this number if you have problems the morning of surgery: [phone]   Remember:   Do not eat food:After Midnight.  May have clear liquids: up to 4 Hours before arrival until 0400 am.  Clear liquids include soda, tea, black coffee, apple or grape juice, broth.  Take these medicines the morning of surgery with A SIP OF WATER: Albuterol inhaler if needed for shortness of breath, Gabapentin (Neurontin), Hydrocodone (Vicodin) if needed for pain, Pantoprazole (Protonix), Febuxostat (Uloric) and Venlafaxine (Effexor XR).   Do not wear jewelry, make-up or nail polish.  Do not wear lotions, powders, or perfumes. You may wear deodorant.  Do not shave 48 hours prior to surgery. Men may shave face and neck.  Do not bring valuables to the hospital.  Contacts, dentures or bridgework may not be worn into surgery.  Leave suitcase in the car. After surgery it may be brought to your room.  For patients admitted to the hospital, checkout time is 11:00 AM the day of discharge.   Patients discharged the day of surgery will not be allowed to drive home.  Name and phone number of your driver:   Special Instructions: CHG Shower Use Special Wash: 1/2 bottle night before surgery and 1/2 bottle morning of surgery.   Please read over the following fact sheets that you were given: Pain Booklet, Coughing and Deep Breathing, Blood Transfusion Information, Total Joint Packet, MRSA Information and Surgical Site Infection Prevention

## 2011-08-12 NOTE — Consult Note (Addendum)
Anesthesia Chart Review:  Patient is a 56 year old female scheduled for right TKR on 08/18/11.  History includes former smoker, DM2, HTN, CKD, gout, asthma.  PCP is listed as Dr. Samuel Jester [phone]).  She provided medical clearance for this procedure.    CXR on 08/11/11 showed no evidence of acute cardiopulmonary process.  EKG on 6/5//13 showed ST @ 104, poor r wave progression.   She was evaluated by [facility] for chest pain in August of 2010.  A stress echo and 2D echo were done at [facility] (copies found in [facility]).  See below for results.  No further cardiac work-up was recommended at that time.  2D echo on 11/05/11 showed mild LVH, LV wall motion and contractility WNL, EF 60-65%, abnormal LV diastolic filling consistent with impaired relaxation, trace MR, trace TR, RV systolic pressure 22 mmHg.  Stress echo on 11/05/11 was negative for ischemia, EF > 65%.  Labs reviewed.  K+ 5.3, BUN 77, Cr 2.26, WBC 10.6, H/H 12.0/37.2, coags WNL.  Last available Cr was 1.57 on 01/02/11.   Sherri at Dr. Greig Right office notified on K+, BUN/Cr results.  I have also spoken with Dr. Silvana Newness office.  She will contact Ms. Karle and have her stop her Metformin and possibly ACEI and recheck her labs in her office on 08/16/11.  I've asked Dr. Charm Barges to update Dr. Eulah Pont on her recommendations after she reviews patient's follow-up lab results.  I'll order a repeat BMET as well pre-operatively.  Sherri updated, and she will communicate above with Dr. Eulah Pont.  Addendum: 08/17/11 1430  Patient had repeat labs at [facility] on 08/16/11, and by notes, her BUN/Cr were down to 59/1.67.  Subsequently, Dr. Charm Barges did clear Ms. Mayo for this procedure.  Shonna Chock, PA-C

## 2011-08-17 MED ORDER — CEFAZOLIN SODIUM-DEXTROSE 2-3 GM-% IV SOLR
2.0000 g | INTRAVENOUS | Status: AC
  Administered 2011-08-18: 2 g via INTRAVENOUS
  Filled 2011-08-17: qty 50

## 2011-08-17 NOTE — Progress Notes (Signed)
Patient called and notified of time change with surgery. Pt instructed to arrive at 0700 am instead of 0800. Pt verbalized understanding.

## 2011-08-17 NOTE — H&P (Signed)
  [facility] [address]   [address] , [place] [address] [phone] A Division of [facility]  Loreta Ave, M.D.     Robert A. Thurston Hole, M.D.     Lunette Stands, M.D. Eulas Post, M.D.    Buford Dresser, M.D. Estell Harpin, M.D. Ralene Cork, D.O.          Genene Churn. Barry Dienes, PA-C            Kirstin A. Shepperson, PA-C [place], OPA-C   RE: [name] [name], [name] [name]                                [id number]      DOB: 08/16/1955 PROGRESS NOTE: 08-10-11 Chief complaint: Right knee pain.  History of present illness: 56 year-old white female with a history of end stage DJD, right knee, and chronic pain.  Returns.  States that knee symptoms unchanged from previous visit.  She is wanting to proceed with total knee replacement as scheduled.  Last office note reviewed from Dr. Samuel Jester, primary care physician.   Current medications: Glucosamine Chondroitin, fish oil, Co-Q10, Vicodin, Calcium, Flexeril, Vitamin D, Naproxen, Pantoprazole, Lisinopril/HCTZ, Metformin, Venlafaxine, Glipizide, Gabapentin, Lasix, Aspirin and Byetta.   Allergies: Tetracycline, question hepatitis. Past medical/surgical history: Diabetes, hyperlipidemia, Vitamin D deficiency, obesity, peripheral edema, lumbar degenerative disc disease, bilateral knee DJD, depression, GERD, hypertension, gout and peripheral neuropathy.   Review of systems: Patient denies lightheadedness, dizziness, fevers or chills, cardiac, pulmonary, GI, GU or neuro issues. Family history: Positive for heart disease, hypertension and cancer. Social history: Quit smoking in 1988, denies alcohol use.  Patient is divorced and currently on disability.                EXAMINATION: Height: 5?0.  Weight: 227 pounds.  Respirations: 20.  Blood pressure: 125/82.  Pulse: 108.  Temperature: 98.6.  Pleasant white female, alert and oriented x 3 and in no acute distress.  Head is  normocephalic, a traumatic.  PERRLA, EOMI.  Cervical spine unremarkable.  Lungs: CTA bilaterally.  No wheezes.  Heart: RRR, tachycardia.  S1 and S2.  No murmurs.  Abdomen: Round.  NBS x 4.  Soft and non-tender.  Right knee: Decreased range of motion.  Positive crepitus.  Positive effusion.  Joint line tender.  Ligaments stable.  Bilateral calves non-tender.  Neurovascularly intact.  Skin warm and dry.      IMPRESSION: End stage DJD, right knee, and chronic pain.  Failed conservative treatment.   PLAN: We will proceed with right total knee replacement as scheduled.  Surgical procedure, along with potential rehab/recovery time discussed.  All questions answered.    Loreta Ave, M.D.   Electronically verified by Loreta Ave, M.D. DFM(JMO):jjh D 08-10-11 T 08-11-11

## 2011-08-18 ENCOUNTER — Ambulatory Visit (HOSPITAL_COMMUNITY): Payer: Medicare Other | Admitting: Vascular Surgery

## 2011-08-18 ENCOUNTER — Encounter (HOSPITAL_COMMUNITY): Payer: Self-pay | Admitting: *Deleted

## 2011-08-18 ENCOUNTER — Inpatient Hospital Stay (HOSPITAL_COMMUNITY): Payer: Medicare Other

## 2011-08-18 ENCOUNTER — Encounter (HOSPITAL_COMMUNITY)
Admission: RE | Disposition: A | Discharge status: Skilled Nursing Facility | Payer: Self-pay | Source: Ambulatory Visit | Attending: Orthopedic Surgery

## 2011-08-18 ENCOUNTER — Encounter (HOSPITAL_COMMUNITY): Payer: Self-pay | Admitting: Vascular Surgery

## 2011-08-18 ENCOUNTER — Inpatient Hospital Stay (HOSPITAL_COMMUNITY)
Admission: RE | Admit: 2011-08-18 | Discharge: 2011-08-21 | DRG: 470 | Disposition: A | Discharge status: Skilled Nursing Facility | Payer: Medicare Other | Source: Ambulatory Visit | Attending: Orthopedic Surgery | Admitting: Orthopedic Surgery

## 2011-08-18 DIAGNOSIS — Z87891 Personal history of nicotine dependence: Secondary | ICD-10-CM

## 2011-08-18 DIAGNOSIS — E559 Vitamin D deficiency, unspecified: Secondary | ICD-10-CM | POA: Diagnosis present

## 2011-08-18 DIAGNOSIS — F329 Major depressive disorder, single episode, unspecified: Secondary | ICD-10-CM | POA: Diagnosis present

## 2011-08-18 DIAGNOSIS — E119 Type 2 diabetes mellitus without complications: Secondary | ICD-10-CM | POA: Diagnosis present

## 2011-08-18 DIAGNOSIS — M1710 Unilateral primary osteoarthritis, unspecified knee: Principal | ICD-10-CM | POA: Diagnosis present

## 2011-08-18 DIAGNOSIS — Z01810 Encounter for preprocedural cardiovascular examination: Secondary | ICD-10-CM

## 2011-08-18 DIAGNOSIS — M109 Gout, unspecified: Secondary | ICD-10-CM | POA: Diagnosis present

## 2011-08-18 DIAGNOSIS — R609 Edema, unspecified: Secondary | ICD-10-CM | POA: Diagnosis present

## 2011-08-18 DIAGNOSIS — Z7982 Long term (current) use of aspirin: Secondary | ICD-10-CM

## 2011-08-18 DIAGNOSIS — G609 Hereditary and idiopathic neuropathy, unspecified: Secondary | ICD-10-CM | POA: Diagnosis present

## 2011-08-18 DIAGNOSIS — M5137 Other intervertebral disc degeneration, lumbosacral region: Secondary | ICD-10-CM | POA: Diagnosis present

## 2011-08-18 DIAGNOSIS — M51379 Other intervertebral disc degeneration, lumbosacral region without mention of lumbar back pain or lower extremity pain: Secondary | ICD-10-CM | POA: Diagnosis present

## 2011-08-18 DIAGNOSIS — Z01812 Encounter for preprocedural laboratory examination: Secondary | ICD-10-CM

## 2011-08-18 DIAGNOSIS — Z23 Encounter for immunization: Secondary | ICD-10-CM

## 2011-08-18 DIAGNOSIS — M24569 Contracture, unspecified knee: Secondary | ICD-10-CM | POA: Diagnosis present

## 2011-08-18 DIAGNOSIS — D62 Acute posthemorrhagic anemia: Secondary | ICD-10-CM | POA: Diagnosis not present

## 2011-08-18 DIAGNOSIS — I10 Essential (primary) hypertension: Secondary | ICD-10-CM | POA: Diagnosis present

## 2011-08-18 DIAGNOSIS — Z01811 Encounter for preprocedural respiratory examination: Secondary | ICD-10-CM

## 2011-08-18 DIAGNOSIS — F3289 Other specified depressive episodes: Secondary | ICD-10-CM | POA: Diagnosis present

## 2011-08-18 DIAGNOSIS — E785 Hyperlipidemia, unspecified: Secondary | ICD-10-CM | POA: Diagnosis present

## 2011-08-18 DIAGNOSIS — Z79899 Other long term (current) drug therapy: Secondary | ICD-10-CM

## 2011-08-18 DIAGNOSIS — E669 Obesity, unspecified: Secondary | ICD-10-CM | POA: Diagnosis present

## 2011-08-18 DIAGNOSIS — Z471 Aftercare following joint replacement surgery: Secondary | ICD-10-CM

## 2011-08-18 DIAGNOSIS — Z01818 Encounter for other preprocedural examination: Secondary | ICD-10-CM

## 2011-08-18 DIAGNOSIS — K219 Gastro-esophageal reflux disease without esophagitis: Secondary | ICD-10-CM | POA: Diagnosis present

## 2011-08-18 HISTORY — PX: TOTAL KNEE ARTHROPLASTY: SHX125

## 2011-08-18 LAB — BASIC METABOLIC PANEL
CO2: 26 mEq/L (ref 19–32)
Chloride: 101 mEq/L (ref 96–112)
GFR calc Af Amer: 56 mL/min — ABNORMAL LOW (ref >90)
Potassium: 4.1 mEq/L (ref 3.5–5.1)

## 2011-08-18 LAB — GLUCOSE, CAPILLARY
Glucose-Capillary: 111 mg/dL — ABNORMAL HIGH (ref 70–99)
Glucose-Capillary: 173 mg/dL — ABNORMAL HIGH (ref 70–99)

## 2011-08-18 SURGERY — ARTHROPLASTY, KNEE, TOTAL
Anesthesia: General | Site: Knee | Laterality: Right | Wound class: Clean

## 2011-08-18 MED ORDER — MORPHINE SULFATE 4 MG/ML IJ SOLN
INTRAMUSCULAR | Status: AC
  Filled 2011-08-18: qty 1

## 2011-08-18 MED ORDER — MIDAZOLAM HCL 2 MG/2ML IJ SOLN
INTRAMUSCULAR | Status: AC
  Filled 2011-08-18: qty 2

## 2011-08-18 MED ORDER — FUROSEMIDE 40 MG PO TABS
40.0000 mg | ORAL_TABLET | Freq: Every day | ORAL | Status: DC
  Administered 2011-08-20 – 2011-08-21 (×2): 40 mg via ORAL
  Filled 2011-08-18 (×5): qty 1

## 2011-08-18 MED ORDER — PNEUMOCOCCAL VAC POLYVALENT 25 MCG/0.5ML IJ INJ
0.5000 mL | INJECTION | INTRAMUSCULAR | Status: AC
  Administered 2011-08-19: 0.5 mL via INTRAMUSCULAR
  Filled 2011-08-18: qty 0.5

## 2011-08-18 MED ORDER — WARFARIN VIDEO
Freq: Once | Status: AC
  Administered 2011-08-19: 10:00:00

## 2011-08-18 MED ORDER — PHENOL 1.4 % MT LIQD: 1.0000 | OROMUCOSAL | Status: DC | PRN

## 2011-08-18 MED ORDER — GLIPIZIDE ER 5 MG PO TB24
5.0000 mg | ORAL_TABLET | Freq: Every day | ORAL | Status: DC
  Administered 2011-08-19 – 2011-08-21 (×3): 5 mg via ORAL
  Filled 2011-08-18 (×5): qty 1

## 2011-08-18 MED ORDER — FLEET ENEMA 7-19 GM/118ML RE ENEM: 1.0000 | ENEMA | Freq: Once | RECTAL | Status: AC | PRN

## 2011-08-18 MED ORDER — DIPHENHYDRAMINE HCL 50 MG/ML IJ SOLN
12.5000 mg | Freq: Four times a day (QID) | INTRAMUSCULAR | Status: DC | PRN
  Filled 2011-08-18: qty 0.25

## 2011-08-18 MED ORDER — ACETAMINOPHEN 10 MG/ML IV SOLN
INTRAVENOUS | Status: AC
  Administered 2011-08-18: 1000 mg via INTRAVENOUS
  Filled 2011-08-18: qty 100

## 2011-08-18 MED ORDER — SENNOSIDES-DOCUSATE SODIUM 8.6-50 MG PO TABS: 1.0000 | ORAL_TABLET | Freq: Every evening | ORAL | Status: DC | PRN

## 2011-08-18 MED ORDER — LORATADINE 10 MG PO TABS
10.0000 mg | ORAL_TABLET | Freq: Every day | ORAL | Status: DC
  Administered 2011-08-18 – 2011-08-21 (×4): 10 mg via ORAL
  Filled 2011-08-18 (×4): qty 1

## 2011-08-18 MED ORDER — ACETAMINOPHEN 650 MG RE SUPP: 650.0000 mg | Freq: Four times a day (QID) | RECTAL | Status: DC | PRN

## 2011-08-18 MED ORDER — PROPOFOL 10 MG/ML IV EMUL: INTRAVENOUS | Status: DC | PRN

## 2011-08-18 MED ORDER — MORPHINE SULFATE 4 MG/ML IJ SOLN
INTRAMUSCULAR | Status: DC | PRN
  Administered 2011-08-18: 4 mg via INTRAVENOUS

## 2011-08-18 MED ORDER — ONDANSETRON HCL 4 MG/2ML IJ SOLN: 4.0000 mg | Freq: Once | INTRAMUSCULAR | Status: DC | PRN

## 2011-08-18 MED ORDER — NALOXONE HCL 0.4 MG/ML IJ SOLN
0.4000 mg | INTRAMUSCULAR | Status: DC | PRN
  Filled 2011-08-18: qty 1

## 2011-08-18 MED ORDER — FENTANYL CITRATE 0.05 MG/ML IJ SOLN
INTRAMUSCULAR | Status: DC | PRN
  Administered 2011-08-18: 100 ug via INTRAVENOUS
  Administered 2011-08-18: 25 ug via INTRAVENOUS
  Administered 2011-08-18: 75 ug via INTRAVENOUS

## 2011-08-18 MED ORDER — ONDANSETRON HCL 4 MG PO TABS
4.0000 mg | ORAL_TABLET | Freq: Four times a day (QID) | ORAL | Status: DC | PRN
  Administered 2011-08-20: 4 mg via ORAL
  Filled 2011-08-18: qty 1

## 2011-08-18 MED ORDER — METHOCARBAMOL 500 MG PO TABS: 500.0000 mg | ORAL_TABLET | Freq: Four times a day (QID) | ORAL | Status: DC | PRN

## 2011-08-18 MED ORDER — GABAPENTIN 100 MG PO CAPS
100.0000 mg | ORAL_CAPSULE | Freq: Two times a day (BID) | ORAL | Status: DC
  Administered 2011-08-18 – 2011-08-21 (×6): 100 mg via ORAL
  Filled 2011-08-18 (×8): qty 1

## 2011-08-18 MED ORDER — ACETAMINOPHEN 10 MG/ML IV SOLN
1000.0000 mg | Freq: Once | INTRAVENOUS | Status: AC | PRN
  Administered 2011-08-18: 1000 mg via INTRAVENOUS

## 2011-08-18 MED ORDER — VITAMIN D (ERGOCALCIFEROL) 1.25 MG (50000 UNIT) PO CAPS
50000.0000 [IU] | ORAL_CAPSULE | ORAL | Status: DC
  Filled 2011-08-18: qty 1

## 2011-08-18 MED ORDER — METOCLOPRAMIDE HCL 10 MG PO TABS
5.0000 mg | ORAL_TABLET | Freq: Three times a day (TID) | ORAL | Status: DC | PRN
  Administered 2011-08-19: 10 mg via ORAL
  Filled 2011-08-18: qty 1

## 2011-08-18 MED ORDER — ATORVASTATIN CALCIUM 10 MG PO TABS
10.0000 mg | ORAL_TABLET | Freq: Every day | ORAL | Status: DC
  Administered 2011-08-18 – 2011-08-20 (×2): 10 mg via ORAL
  Filled 2011-08-18 (×4): qty 1

## 2011-08-18 MED ORDER — PATIENT'S GUIDE TO USING COUMADIN BOOK
Freq: Once | Status: AC
  Administered 2011-08-18: 1
  Filled 2011-08-18: qty 1

## 2011-08-18 MED ORDER — MIDAZOLAM HCL 2 MG/2ML IJ SOLN
1.0000 mg | INTRAMUSCULAR | Status: DC | PRN
  Administered 2011-08-18: 1 mg via INTRAVENOUS

## 2011-08-18 MED ORDER — METOCLOPRAMIDE HCL 5 MG/ML IJ SOLN
5.0000 mg | Freq: Three times a day (TID) | INTRAMUSCULAR | Status: DC | PRN
  Administered 2011-08-20: 10 mg via INTRAVENOUS
  Filled 2011-08-18 (×2): qty 2

## 2011-08-18 MED ORDER — BUPIVACAINE HCL (PF) 0.25 % IJ SOLN
INTRAMUSCULAR | Status: DC | PRN
  Administered 2011-08-18: 30 mL via INTRA_ARTICULAR

## 2011-08-18 MED ORDER — INSULIN ASPART 100 UNIT/ML ~~LOC~~ SOLN
0.0000 [IU] | Freq: Three times a day (TID) | SUBCUTANEOUS | Status: DC
  Administered 2011-08-19 (×2): 3 [IU] via SUBCUTANEOUS
  Administered 2011-08-19 – 2011-08-20 (×2): 2 [IU] via SUBCUTANEOUS
  Administered 2011-08-20: 3 [IU] via SUBCUTANEOUS
  Administered 2011-08-21: 2 [IU] via SUBCUTANEOUS

## 2011-08-18 MED ORDER — ACETAMINOPHEN 325 MG PO TABS: 650.0000 mg | ORAL_TABLET | Freq: Four times a day (QID) | ORAL | Status: DC | PRN

## 2011-08-18 MED ORDER — SODIUM CHLORIDE 0.9 % IR SOLN
Status: DC | PRN
  Administered 2011-08-18: 1000 mL

## 2011-08-18 MED ORDER — ENOXAPARIN SODIUM 30 MG/0.3ML ~~LOC~~ SOLN
30.0000 mg | Freq: Two times a day (BID) | SUBCUTANEOUS | Status: DC
Start: 2011-08-19 — End: 2011-08-21
  Administered 2011-08-19 – 2011-08-21 (×5): 30 mg via SUBCUTANEOUS
  Filled 2011-08-18 (×7): qty 0.3

## 2011-08-18 MED ORDER — WARFARIN SODIUM 7.5 MG PO TABS
7.5000 mg | ORAL_TABLET | Freq: Once | ORAL | Status: AC
  Administered 2011-08-18: 7.5 mg via ORAL
  Filled 2011-08-18: qty 1

## 2011-08-18 MED ORDER — FENTANYL CITRATE 0.05 MG/ML IJ SOLN
50.0000 ug | INTRAMUSCULAR | Status: DC | PRN
  Administered 2011-08-18: 50 ug via INTRAVENOUS

## 2011-08-18 MED ORDER — METFORMIN HCL 500 MG PO TABS
1000.0000 mg | ORAL_TABLET | Freq: Two times a day (BID) | ORAL | Status: DC
  Administered 2011-08-20 – 2011-08-21 (×2): 1000 mg via ORAL
  Filled 2011-08-18 (×8): qty 2

## 2011-08-18 MED ORDER — DIPHENHYDRAMINE HCL 12.5 MG/5ML PO ELIX
12.5000 mg | ORAL_SOLUTION | Freq: Four times a day (QID) | ORAL | Status: DC | PRN
  Filled 2011-08-18: qty 5

## 2011-08-18 MED ORDER — MENTHOL 3 MG MT LOZG: 1.0000 | LOZENGE | OROMUCOSAL | Status: DC | PRN

## 2011-08-18 MED ORDER — ALBUTEROL SULFATE HFA 108 (90 BASE) MCG/ACT IN AERS
2.0000 | INHALATION_SPRAY | Freq: Four times a day (QID) | RESPIRATORY_TRACT | Status: DC | PRN
  Filled 2011-08-18: qty 6.7

## 2011-08-18 MED ORDER — NEOSTIGMINE METHYLSULFATE 1 MG/ML IJ SOLN
INTRAMUSCULAR | Status: DC | PRN
  Administered 2011-08-18: 4 mg via INTRAVENOUS

## 2011-08-18 MED ORDER — LACTATED RINGERS IV SOLN
INTRAVENOUS | Status: DC | PRN
  Administered 2011-08-18 (×2): via INTRAVENOUS

## 2011-08-18 MED ORDER — PANTOPRAZOLE SODIUM 40 MG PO TBEC
40.0000 mg | DELAYED_RELEASE_TABLET | Freq: Every day | ORAL | Status: DC
  Administered 2011-08-19 – 2011-08-21 (×3): 40 mg via ORAL
  Filled 2011-08-18 (×3): qty 1

## 2011-08-18 MED ORDER — POTASSIUM CHLORIDE IN NACL 20-0.9 MEQ/L-% IV SOLN
INTRAVENOUS | Status: DC
  Administered 2011-08-18: 1000 mL via INTRAVENOUS
  Filled 2011-08-18 (×10): qty 1000

## 2011-08-18 MED ORDER — GLYCOPYRROLATE 0.2 MG/ML IJ SOLN
INTRAMUSCULAR | Status: DC | PRN
  Administered 2011-08-18: .6 mg via INTRAVENOUS

## 2011-08-18 MED ORDER — FEBUXOSTAT 80 MG PO TABS
1.0000 | ORAL_TABLET | Freq: Every day | ORAL | Status: DC
  Administered 2011-08-19 – 2011-08-21 (×3): 80 mg via ORAL
  Filled 2011-08-18 (×4): qty 1

## 2011-08-18 MED ORDER — SODIUM CHLORIDE 0.9 % IJ SOLN: 9.0000 mL | INTRAMUSCULAR | Status: DC | PRN

## 2011-08-18 MED ORDER — ONDANSETRON HCL 4 MG/2ML IJ SOLN: 4.0000 mg | Freq: Four times a day (QID) | INTRAMUSCULAR | Status: DC | PRN

## 2011-08-18 MED ORDER — LACTATED RINGERS IV SOLN
INTRAVENOUS | Status: DC
  Administered 2011-08-18: 09:00:00 via INTRAVENOUS

## 2011-08-18 MED ORDER — FENTANYL CITRATE 0.05 MG/ML IJ SOLN
INTRAMUSCULAR | Status: AC
  Filled 2011-08-18: qty 2

## 2011-08-18 MED ORDER — ONDANSETRON HCL 4 MG/2ML IJ SOLN
4.0000 mg | Freq: Four times a day (QID) | INTRAMUSCULAR | Status: DC | PRN
  Filled 2011-08-18: qty 2

## 2011-08-18 MED ORDER — MIDAZOLAM HCL 5 MG/5ML IJ SOLN
INTRAMUSCULAR | Status: DC | PRN
  Administered 2011-08-18: 1 mg via INTRAVENOUS

## 2011-08-18 MED ORDER — HYDROMORPHONE HCL PF 1 MG/ML IJ SOLN: 0.2500 mg | INTRAMUSCULAR | Status: DC | PRN

## 2011-08-18 MED ORDER — ONDANSETRON HCL 4 MG/2ML IJ SOLN
INTRAMUSCULAR | Status: DC | PRN
  Administered 2011-08-18: 4 mg via INTRAVENOUS

## 2011-08-18 MED ORDER — WARFARIN - PHARMACIST DOSING INPATIENT: Freq: Every day | Status: DC

## 2011-08-18 MED ORDER — ROCURONIUM BROMIDE 100 MG/10ML IV SOLN
INTRAVENOUS | Status: DC | PRN
  Administered 2011-08-18: 50 mg via INTRAVENOUS

## 2011-08-18 MED ORDER — EXENATIDE 10 MCG/0.04ML ~~LOC~~ SOPN
10.0000 ug | PEN_INJECTOR | Freq: Two times a day (BID) | SUBCUTANEOUS | Status: DC
  Administered 2011-08-20: 10 ug via SUBCUTANEOUS
  Filled 2011-08-18 (×8): qty 0.04

## 2011-08-18 MED ORDER — VENLAFAXINE HCL ER 75 MG PO CP24
75.0000 mg | ORAL_CAPSULE | Freq: Every day | ORAL | Status: DC
  Administered 2011-08-19 – 2011-08-21 (×3): 75 mg via ORAL
  Filled 2011-08-18 (×4): qty 1

## 2011-08-18 MED ORDER — MORPHINE SULFATE (PF) 1 MG/ML IV SOLN
INTRAVENOUS | Status: AC
  Filled 2011-08-18: qty 25

## 2011-08-18 MED ORDER — DOCUSATE SODIUM 100 MG PO CAPS
100.0000 mg | ORAL_CAPSULE | Freq: Two times a day (BID) | ORAL | Status: DC
  Administered 2011-08-18 – 2011-08-21 (×6): 100 mg via ORAL
  Filled 2011-08-18 (×7): qty 1

## 2011-08-18 MED ORDER — CEFAZOLIN SODIUM 1-5 GM-% IV SOLN
1.0000 g | Freq: Three times a day (TID) | INTRAVENOUS | Status: AC
  Administered 2011-08-18 – 2011-08-19 (×3): 1 g via INTRAVENOUS
  Filled 2011-08-18 (×3): qty 50

## 2011-08-18 MED ORDER — PROPOFOL 10 MG/ML IV EMUL
INTRAVENOUS | Status: DC | PRN
  Administered 2011-08-18: 150 mg via INTRAVENOUS

## 2011-08-18 MED ORDER — METHOCARBAMOL 100 MG/ML IJ SOLN
500.0000 mg | Freq: Four times a day (QID) | INTRAVENOUS | Status: DC | PRN
  Filled 2011-08-18: qty 5

## 2011-08-18 MED ORDER — MORPHINE SULFATE (PF) 1 MG/ML IV SOLN
INTRAVENOUS | Status: DC
  Administered 2011-08-18: 0.1 mg via INTRAVENOUS
  Administered 2011-08-18: 13:00:00 via INTRAVENOUS
  Administered 2011-08-19: 6 mg via INTRAVENOUS
  Administered 2011-08-19: 5 mg via INTRAVENOUS
  Administered 2011-08-19: 6 mg via INTRAVENOUS

## 2011-08-18 SURGICAL SUPPLY — 61 items
BANDAGE ESMARK 6X9 LF (GAUZE/BANDAGES/DRESSINGS) ×1 IMPLANT
BLADE SAG 18X100X1.27 (BLADE) ×4 IMPLANT
BNDG ESMARK 6X9 LF (GAUZE/BANDAGES/DRESSINGS) ×2
BOOTCOVER CLEANROOM LRG (PROTECTIVE WEAR) ×4 IMPLANT
BOWL SMART MIX CTS (DISPOSABLE) ×2 IMPLANT
CEMENT BONE SIMPLEX SPEEDSET (Cement) ×4 IMPLANT
CLOTH BEACON ORANGE TIMEOUT ST (SAFETY) ×2 IMPLANT
COVER BACK TABLE 24X17X13 BIG (DRAPES) IMPLANT
COVER SURGICAL LIGHT HANDLE (MISCELLANEOUS) ×2 IMPLANT
CUFF TOURNIQUET SINGLE 34IN LL (TOURNIQUET CUFF) ×2 IMPLANT
DRAPE EXTREMITY T 121X128X90 (DRAPE) ×2 IMPLANT
DRAPE PROXIMA HALF (DRAPES) ×4 IMPLANT
DRAPE U-SHAPE 47X51 STRL (DRAPES) ×2 IMPLANT
DRSG PAD ABDOMINAL 8X10 ST (GAUZE/BANDAGES/DRESSINGS) IMPLANT
DURAPREP 26ML APPLICATOR (WOUND CARE) ×2 IMPLANT
ELECT CAUTERY BLADE 6.4 (BLADE) ×2 IMPLANT
ELECT REM PT RETURN 9FT ADLT (ELECTROSURGICAL) ×2
ELECTRODE REM PT RTRN 9FT ADLT (ELECTROSURGICAL) ×1 IMPLANT
EVACUATOR 1/8 PVC DRAIN (DRAIN) ×2 IMPLANT
FACESHIELD LNG OPTICON STERILE (SAFETY) ×2 IMPLANT
GAUZE XEROFORM 5X9 LF (GAUZE/BANDAGES/DRESSINGS) IMPLANT
GLOVE BIO SURGEON STRL SZ8.5 (GLOVE) ×2 IMPLANT
GLOVE BIOGEL PI IND STRL 8 (GLOVE) ×1 IMPLANT
GLOVE BIOGEL PI INDICATOR 8 (GLOVE) ×1
GLOVE ORTHO TXT STRL SZ7.5 (GLOVE) ×2 IMPLANT
GLOVE SURG SS PI 8.5 STRL IVOR (GLOVE) ×1
GLOVE SURG SS PI 8.5 STRL STRW (GLOVE) ×1 IMPLANT
GOWN PREVENTION PLUS XLARGE (GOWN DISPOSABLE) ×2 IMPLANT
GOWN PREVENTION PLUS XXLARGE (GOWN DISPOSABLE) ×4 IMPLANT
GOWN STRL NON-REIN LRG LVL3 (GOWN DISPOSABLE) IMPLANT
GOWN STRL REIN 2XL XLG LVL4 (GOWN DISPOSABLE) ×2 IMPLANT
HANDPIECE INTERPULSE COAX TIP (DISPOSABLE) ×1
IMMOBILIZER KNEE 20 (SOFTGOODS) ×2
IMMOBILIZER KNEE 20 THIGH 36 (SOFTGOODS) ×1 IMPLANT
IMMOBILIZER KNEE 22 UNIV (SOFTGOODS) IMPLANT
IMMOBILIZER KNEE 24 THIGH 36 (MISCELLANEOUS) IMPLANT
IMMOBILIZER KNEE 24 UNIV (MISCELLANEOUS)
KIT BASIN OR (CUSTOM PROCEDURE TRAY) ×2 IMPLANT
KIT ROOM TURNOVER OR (KITS) ×2 IMPLANT
MANIFOLD NEPTUNE II (INSTRUMENTS) ×2 IMPLANT
NS IRRIG 1000ML POUR BTL (IV SOLUTION) ×2 IMPLANT
PACK TOTAL JOINT (CUSTOM PROCEDURE TRAY) ×2 IMPLANT
PAD ARMBOARD 7.5X6 YLW CONV (MISCELLANEOUS) ×4 IMPLANT
PAD CAST 4YDX4 CTTN HI CHSV (CAST SUPPLIES) ×1 IMPLANT
PADDING CAST COTTON 4X4 STRL (CAST SUPPLIES) ×1
PADDING CAST COTTON 6X4 STRL (CAST SUPPLIES) ×2 IMPLANT
RUBBERBAND STERILE (MISCELLANEOUS) ×2 IMPLANT
SET HNDPC FAN SPRY TIP SCT (DISPOSABLE) ×1 IMPLANT
SPONGE GAUZE 4X4 12PLY (GAUZE/BANDAGES/DRESSINGS) IMPLANT
STAPLER VISISTAT 35W (STAPLE) ×2 IMPLANT
SUCTION FRAZIER TIP 10 FR DISP (SUCTIONS) ×2 IMPLANT
SUT VIC AB 1 CTX 36 (SUTURE) ×2
SUT VIC AB 1 CTX36XBRD ANBCTR (SUTURE) ×2 IMPLANT
SUT VIC AB 2-0 CT1 27 (SUTURE) ×2
SUT VIC AB 2-0 CT1 TAPERPNT 27 (SUTURE) ×2 IMPLANT
SYR 30ML LL (SYRINGE) ×2 IMPLANT
SYR 30ML SLIP (SYRINGE) IMPLANT
TOWEL OR 17X24 6PK STRL BLUE (TOWEL DISPOSABLE) ×2 IMPLANT
TOWEL OR 17X26 10 PK STRL BLUE (TOWEL DISPOSABLE) ×2 IMPLANT
TRAY FOLEY CATH 14FR (SET/KITS/TRAYS/PACK) ×2 IMPLANT
WATER STERILE IRR 1000ML POUR (IV SOLUTION) ×4 IMPLANT

## 2011-08-18 NOTE — Anesthesia Procedure Notes (Signed)
Anesthesia Regional Block:  Femoral nerve block  Pre-Anesthetic Checklist: ,, timeout performed, Correct Patient, Correct Site, Correct Laterality, Correct Procedure, Correct Position, site marked, Risks and benefits discussed,  Surgical consent,  Pre-op evaluation,  At surgeon's request and post-op pain management  Laterality: Right  Prep: chloraprep       Needles:  Injection technique: Single-shot  Needle Type: Echogenic Stimulator Needle     Needle Length:cm 9 cm Needle Gauge: 22 and 22 G    Additional Needles:  Procedures: ultrasound guided Femoral nerve block Narrative:  Start time: 08/18/2011 10:00 AM End time: 08/18/2011 10:10 AM  Performed by: Personally   Additional Notes: 30 cc 0.5% marcaine 1:200 Epi injected without difficulty  Kipp Brood, MD

## 2011-08-18 NOTE — Interval H&P Note (Signed)
History and Physical Interval Note:  08/18/2011 8:26 AM  [name] [name]  has presented today for surgery, with the diagnosis of DJD RIGHT KNEE  The various methods of treatment have been discussed with the patient and family. After consideration of risks, benefits and other options for treatment, the patient has consented to  Procedure(s) (LRB): TOTAL KNEE ARTHROPLASTY (Right) as a surgical intervention .  The patients' history has been reviewed, patient examined, no change in status, stable for surgery.  I have reviewed the patients' chart and labs.  Questions were answered to the patient's satisfaction.     [name]   

## 2011-08-18 NOTE — Interval H&P Note (Signed)
History and Physical Interval Note:  08/18/2011 8:26 AM  [name] [name]  has presented today for surgery, with the diagnosis of DJD RIGHT KNEE  The various methods of treatment have been discussed with the patient and family. After consideration of risks, benefits and other options for treatment, the patient has consented to  Procedure(s) (LRB): TOTAL KNEE ARTHROPLASTY (Right) as a surgical intervention .  The patients' history has been reviewed, patient examined, no change in status, stable for surgery.  I have reviewed the patients' chart and labs.  Questions were answered to the patient's satisfaction.     Sarath Privott F

## 2011-08-18 NOTE — Transfer of Care (Signed)
Immediate Anesthesia Transfer of Care Note  Patient: [name] [name]  Procedure(s) Performed: Procedure(s) (LRB): TOTAL KNEE ARTHROPLASTY (Right)  Patient Location: PACU  Anesthesia Type: General and Regional  Level of Consciousness: awake, alert , oriented and sedated  Airway & Oxygen Therapy: Patient Spontanous Breathing and Patient connected to nasal cannula oxygen  Post-op Assessment: Report given to PACU RN, Post -op Vital signs reviewed and stable and Patient moving all extremities  Post vital signs: Reviewed and stable  Complications: No apparent anesthesia complications

## 2011-08-18 NOTE — Op Note (Signed)
[name] [name], [name] [name] NO.:  [id number]  MEDICAL RECORD NO.:  [id number]  LOCATION:  5011                         FACILITY:  [facility]  PHYSICIAN:  Loreta Ave, M.D. DATE OF BIRTH:  August 11, 1955  DATE OF PROCEDURE:  08/18/2011 DATE OF DISCHARGE:                              OPERATIVE REPORT   PREOPERATIVE DIAGNOSES:  Right knee end-stage degenerative arthritis. Marked varus alignment.  Tibiofemoral translation.  Mild flexion contracture.  POSTOPERATIVE DIAGNOSES:  Right knee end-stage degenerative arthritis. Marked varus alignment.  Tibiofemoral translation. Mild flexion contracture.  PROCEDURE:  Right knee modified minimally invasive total knee replacement, Stryker triathlon prosthesis.  Soft tissue balancing. Cemented pegged posterior stabilized #3 femoral component.  Cemented #4 tibial component, 9 mm polyethylene insert.  Cemented resurfacing 32 mm patellar component.  SURGEON:  Loreta Ave, M.D.  ASSISTANT:  Zonia Kief P.A., present throughout the entire case necessary for timely completion of procedure.  ANESTHESIA:  General.  BLOOD LOSS:  Minimal.  SPECIMENS:  None.  CULTURES:  None.  COMPLICATION:  None.  DRESSING:  Soft compressive, a knee immobilizer.  DRAINS:  Hemovac x1.  TOURNIQUET TIME:  45 minutes.  PROCEDURE:  The patient was brought to the operating room, placed on the operating table in supine position.  After adequate anesthesia had been obtained, knee examined.  Just about full extension, flexion almost 90. More than 10 degrees of varus, only partially correctable.  Tourniquet applied.  Prepped and draped in usual sterile fashion.  Exsanguinated with elevation, Esmarch, tourniquet inflated to 350 mmHg.  Straight incision above the patella down to the tibial tubercle.  Medial arthrotomy, vastus splitting, preserving quad tendon.  Knee exposed. Medial capsule released.  Remnants of menisci, cruciate ligaments,  loose body spurs removed.  Distal femur exposed.  Intramedullary guide placed. A 8 mm of resection, 5 degrees of valgus.  Using epicondylar axis, the femur was sized, cut, and fitted for posterior stabilized pegged #3 femoral component.  Extramedullary guide on the tibia.  3-degree posterior slope cut.  The tibia was then sized for a #4 component. Debris cleared throughout the knee including posterior recess.  The knee was nicely balanced in flexion and extension.  Patella exposed. Posterior 10 mm removed.  Drilled, sized, and fitted for a 32-mm component.  Trials were put in place throughout.  A #3 on the femur, #4 on the tibia, 9 mm insert and a 32-patella.  With this construct along the medial capsule release, nice full extension, good bio-mechanical axis, nicely balanced in flexion and extension.  Good patellofemoral tracking.  Tibia was marked for rotation and hand reamed.  All trials removed.  Copious irrigation with a pulse irrigating device.  Cement prepared, placed on all components, firmly seated.  Patella held with a clamp.  Once cement hardened, the knee was reexamined.  Irrigated once again.  Hemovac placed and brought out through a separate stab wound. Arthrotomy closed with #1 Vicryl.  Skin and subcutaneous tissue with Vicryl and staples.  Sterile compressive dressing applied.  Tourniquet deflated and removed.  Knee immobilizer applied.  Anesthesia reversed. Brought to recovery room.  Tolerated surgery well.  No complications.  Loreta Ave, M.D.     DFM/MEDQ  D:  08/18/2011  T:  08/18/2011  Job:  [id number]

## 2011-08-18 NOTE — Anesthesia Postprocedure Evaluation (Signed)
  Anesthesia Post-op Note  Patient: [name] [name]  Procedure(s) Performed: Procedure(s) (LRB): TOTAL KNEE ARTHROPLASTY (Right)  Patient Location: PACU  Anesthesia Type: General and GA combined with regional for post-op pain  Level of Consciousness: awake, alert  and oriented  Airway and Oxygen Therapy: Patient Spontanous Breathing and Patient connected to nasal cannula oxygen  Post-op Pain: mild  Post-op Assessment: Post-op Vital signs reviewed and Patient's Cardiovascular Status Stable  Post-op Vital Signs: stable  Complications: No apparent anesthesia complications

## 2011-08-18 NOTE — Anesthesia Preprocedure Evaluation (Addendum)
Anesthesia Evaluation  Patient identified by MRN, date of birth, ID band Patient awake    Reviewed: Allergy & Precautions, H&P , NPO status , Patient's Chart, lab work & pertinent test results  Airway Mallampati: II      Dental  (+) Teeth Intact   Pulmonary  breath sounds clear to auscultation        Cardiovascular Rhythm:Regular Rate:Normal     Neuro/Psych    GI/Hepatic   Endo/Other    Renal/GU      Musculoskeletal   Abdominal (+) + obese,  Abdomen: soft.    Peds  Hematology   Anesthesia Other Findings   Reproductive/Obstetrics                           Anesthesia Physical Anesthesia Plan  ASA: III  Anesthesia Plan: General   Post-op Pain Management:    Induction: Intravenous  Airway Management Planned: Oral ETT  Additional Equipment:   Intra-op Plan:   Post-operative Plan: Extubation in OR  Informed Consent: I have reviewed the patients History and Physical, chart, labs and discussed the procedure including the risks, benefits and alternatives for the proposed anesthesia with the patient or authorized representative who has indicated his/her understanding and acceptance.   Dental advisory given  Plan Discussed with:   Anesthesia Plan Comments: (Obesity Type 2 DM Htn  Plan GA with Femoral Nerve block  Kipp Brood, MD)        Anesthesia Quick Evaluation

## 2011-08-18 NOTE — Preoperative (Signed)
Beta Blockers   Reason not to administer Beta Blockers:Not Applicable 

## 2011-08-18 NOTE — Progress Notes (Signed)
Orthopedic Tech Progress Note Patient Details:  [name] [name] 1956-01-31 [id number]  CPM Right Knee CPM Right Knee: On Right Knee Flexion (Degrees): 60  Right Knee Extension (Degrees): 0  Additional Comments: trapeze bar   Cammer, Mickie Bail 08/18/2011, 12:47 PM

## 2011-08-18 NOTE — Progress Notes (Signed)
UR COMPLETED  

## 2011-08-18 NOTE — Progress Notes (Signed)
ANTICOAGULATION CONSULT NOTE - Initial Consult  Pharmacy Consult for Warfarin Indication: VTE prophylaxis  Allergies  Allergen Reactions  . Tetracycline     REACTION: hepatitis    Patient Measurements: Height: 5\' 1"  (154.9 cm) Weight: 227 lb (102.967 kg) IBW/kg (Calculated) : 47.8  Heparin Dosing Weight:   Vital Signs: Temp: 97.3 F (36.3 C) (06/12 1530) Temp src: Oral (06/12 0719) BP: 122/77 mmHg (06/12 1530) Pulse Rate: 77  (06/12 1530)  Labs:  [name] [name] 08/18/11 0731  HGB --  HCT --  PLT --  APTT --  LABPROT --  INR --  HEPARINUNFRC --  CREATININE 1.22*  CKTOTAL --  CKMB --  TROPONINI --    Estimated Creatinine Clearance: 56.1 ml/min (by C-G formula based on Cr of 1.22).   Medical History: Past Medical History  Diagnosis Date  . Gout   . Hypertension   . Diabetes mellitus   . Renal disorder   . Asthma     Medications:  Scheduled:    . atorvastatin  10 mg Oral q1800  .  ceFAZolin (ANCEF) IV  1 g Intravenous Q8H  .  ceFAZolin (ANCEF) IV  2 g Intravenous 60 min Pre-Op  . docusate sodium  100 mg Oral BID  . enoxaparin  30 mg Subcutaneous Q12H  . exenatide  10 mcg Subcutaneous BID WC  . Febuxostat  1 tablet Oral Daily  . furosemide  40 mg Oral Q breakfast  . gabapentin  100 mg Oral BID  . glipiZIDE  5 mg Oral Q breakfast  . insulin aspart  0-15 Units Subcutaneous TID WC  . loratadine  10 mg Oral Daily  . metFORMIN  1,000 mg Oral BID WC  . morphine   Intravenous Q4H  . morphine      . pantoprazole  40 mg Oral Q breakfast  . patient's guide to using coumadin book   Does not apply Once  . pneumococcal 23 valent vaccine  0.5 mL Intramuscular Tomorrow-1000  . venlafaxine XR  75 mg Oral Daily  . Vitamin D (Ergocalciferol)  50,000 Units Oral 2 times weekly  . warfarin  7.5 mg Oral ONCE-1800  . warfarin   Does not apply Once  . Warfarin - Pharmacist Dosing Inpatient   Does not apply q1800    Assessment: 56 yr old female admitted for total knee.  INR 0.89.  Goal of Therapy:  INR 2-3    Plan:  Will order 7.5 mg Coumadin for tonight. Also ordered coumadin booklet and video for pt. Daily PT/INR.   [name] [name] 08/18/2011,4:17 PM

## 2011-08-18 NOTE — Brief Op Note (Signed)
08/18/2011  1:06 PM  PATIENT:  [name] [name]  56 y.o. female  PRE-OPERATIVE DIAGNOSIS:  DJD RIGHT KNEE  POST-OPERATIVE DIAGNOSIS:  DJD RIGHT KNEE  PROCEDURE:  Procedure(s) (LRB): TOTAL KNEE ARTHROPLASTY (Right)  SURGEON:  Surgeon(s) and Role:    * Loreta Ave, MD - Primary  PHYSICIAN ASSISTANT: Zonia Kief M    ANESTHESIA:   regional and general  EBL:  Total I/O In: 1500 [I.V.:1500] Out: 100 [Urine:100]   SPECIMEN:  No Specimen  DISPOSITION OF SPECIMEN:  N/A  COUNTS:  YES  TOURNIQUET:   Total Tourniquet Time Documented: Thigh (Right) - 66 minutes  PATIENT DISPOSITION:  PACU - hemodynamically stable.

## 2011-08-19 LAB — CBC
HCT: 30.5 % — ABNORMAL LOW (ref 36.0–46.0)
Hemoglobin: 9.5 g/dL — ABNORMAL LOW (ref 12.0–15.0)
MCHC: 31.1 g/dL (ref 30.0–36.0)
MCV: 93.3 fL (ref 78.0–100.0)

## 2011-08-19 LAB — GLUCOSE, CAPILLARY
Glucose-Capillary: 169 mg/dL — ABNORMAL HIGH (ref 70–99)
Glucose-Capillary: 181 mg/dL — ABNORMAL HIGH (ref 70–99)

## 2011-08-19 LAB — BASIC METABOLIC PANEL
BUN: 22 mg/dL (ref 6–23)
Chloride: 102 mEq/L (ref 96–112)
GFR calc non Af Amer: 64 mL/min — ABNORMAL LOW (ref >90)
Glucose, Bld: 163 mg/dL — ABNORMAL HIGH (ref 70–99)
Potassium: 4.5 mEq/L (ref 3.5–5.1)

## 2011-08-19 LAB — PROTIME-INR: INR: 1.06 (ref 0.00–1.49)

## 2011-08-19 MED ORDER — OXYCODONE-ACETAMINOPHEN 5-325 MG PO TABS
1.0000 | ORAL_TABLET | ORAL | Status: DC | PRN
  Administered 2011-08-19 (×2): 2 via ORAL
  Filled 2011-08-19 (×2): qty 2

## 2011-08-19 MED ORDER — WARFARIN SODIUM 7.5 MG PO TABS
7.5000 mg | ORAL_TABLET | Freq: Once | ORAL | Status: AC
  Administered 2011-08-19: 7.5 mg via ORAL
  Filled 2011-08-19: qty 1

## 2011-08-19 NOTE — Progress Notes (Signed)
Subjective: Doing well.  Pain controlled.     Objective: Vital signs in last 24 hours: Temp:  [97.1 F (36.2 C)-98.4 F (36.9 C)] 98.1 F (36.7 C) (06/13 0602) Pulse Rate:  [62-101] 62  (06/13 0602) Resp:  [12-18] 18  (06/13 0745) BP: (107-137)/(66-87) 114/74 mmHg (06/13 0602) SpO2:  [90 %-100 %] 99 % (06/13 0745) Weight:  [102.967 kg (227 lb)] 102.967 kg (227 lb) (06/12 1543)  Intake/Output from previous day: 06/12 0701 - 06/13 0700 In: 2820 [P.O.:240; I.V.:2580] Out: 1950 [Urine:1750; Drains:200] Intake/Output this shift:     [facility] 08/19/11 0610  HGB 9.5*    Basename 08/19/11 0610  WBC 10.4  RBC 3.27*  HCT 30.5*  PLT 344    Basename 08/19/11 0610 08/18/11 0731  NA 140 139  K 4.5 4.1  CL 102 101  CO2 25 26  BUN 22 35*  CREATININE 0.97 1.22*  GLUCOSE 163* 133*  CALCIUM 9.0 9.4    Basename 08/19/11 0610  LABPT --  INR 1.06    Exam:  Some bleeding thru dressing.  Calf nt, nvi.    Assessment/Plan: Transfer to [facility] for rehab fri or sat.  D/c pca.     [name] [name] M 08/19/2011, 10:12 AM

## 2011-08-19 NOTE — Clinical Social Work Psychosocial (Addendum)
    Clinical Social Work Department BRIEF PSYCHOSOCIAL ASSESSMENT 08/19/2011  Patient:  [name] [name], [name] [name]     Account Number:  [id number]     Admit date:  08/18/2011  Clinical Social Worker:  Burnard Hawthorne  Date/Time:  08/18/2011 03:00 PM  Referred by:  Physician  Date Referred:  08/18/2011 Referred for  SNF Placement   Other Referral:   Interview type:  Patient Other interview type:    PSYCHOSOCIAL DATA Living Status:  ALONE Admitted from facility:   Level of care:   Primary support name:  Luretha Murphy  [phone] Primary support relationship to patient:  SIBLING Degree of support available:   Very supportive and involved family    CURRENT CONCERNS Current Concerns  Post-Acute Placement   Other Concerns:    SOCIAL WORK ASSESSMENT / PLAN Patient referred for short term SNF placement. She has pre-toured [facility] and arranged for d/c there. Discussed placement process with patient and referral completed to Donne Hazel, Admissions Director of [facility]. They will have a bed available for patient on Saturday, 08/21/11.   Assessment/plan status:  Psychosocial Support/Ongoing Assessment of Needs Other assessment/ plan:   Information/referral to community resources:   Patient offered SNF list but she declined as she has pre-chosen [facility]. Discussed aftercare issues of possible need for Home Health and DME to be arranged as needed by SNF prior to d/c home.    PATIENT'S/FAMILY'S RESPONSE TO PLAN OF CARE: Patient is alert, oriented and very pleasant. She has a positive attitude and states that she wants to go to rehab so she can get home as soon as possible.

## 2011-08-19 NOTE — Progress Notes (Signed)
Physical Therapy Evaluation Note  Past Medical History  Diagnosis Date  . Gout   . Hypertension   . Diabetes mellitus   . Renal disorder   . Asthma    Past Surgical History  Procedure Date  . Cesarean section      08/19/11 0803  PT Visit Information  Last PT Received On 08/19/11  Assistance Needed +1  PT Time Calculation  PT Start Time 0803  PT Stop Time 0825  PT Time Calculation (min) 22 min  Subjective Data  Subjective Pt received sitting up in bed with report "My butt is killing me."  Precautions  Precautions Knee  Restrictions  Weight Bearing Restrictions Yes  RLE Weight Bearing WBAT  Home Living  Lives With Alone  Available Help at Discharge (none)  Type of Home House  Home Access Stairs to enter  Entrance Stairs-Number of Steps 1  Entrance Stairs-Rails None  Home Layout One level  Scientist, physiological Yes  How Accessible Accessible via walker  Home Adaptive Equipment None  Prior Function  Level of Independence Independent  Able to Take Stairs? Yes  Driving Yes  Vocation On disability  Communication  Communication No difficulties  Cognition  Overall Cognitive Status Appears within functional limits for tasks assessed/performed  Arousal/Alertness Awake/alert  Orientation Level Oriented X4 / Intact  Behavior During Session [facility] for tasks performed  Right Upper Extremity Assessment  RUE ROM/Strength/Tone WFL  Left Upper Extremity Assessment  LUE ROM/Strength/Tone WFL  Right Lower Extremity Assessment  RLE ROM/Strength/Tone Deficits;Due to pain  RLE ROM/Strength/Tone Deficits minimal quad set, AA knee flex 40 deg  Left Lower Extremity Assessment  LLE ROM/Strength/Tone WFL for tasks assessed  Trunk Assessment  Trunk Assessment Normal  Bed Mobility  Bed Mobility Supine to Sit  Supine to Sit 4: Min assist;HOB elevated;With rails  Details for Bed Mobility Assistance pt with strong use of  bed rail, initial assist for R LE management  Transfers  Transfers Sit to Stand;Stand to Sit  Sit to Stand 4: Min assist;With armrests;From bed  Stand to Sit 4: Min assist;With upper extremity assist;To chair/3-in-1  Details for Transfer Assistance v/c's for hand placement and safety  Ambulation/Gait  Ambulation/Gait Assistance 4: Min assist  Ambulation Distance (Feet) 15 Feet  Assistive device Rolling walker  Ambulation/Gait Assistance Details max directional v/c's for sequencing and to complete R quad set to prevent R knee from buckling. Pt with improved sequencing towards end of ambulation.   Gait Pattern Step-to pattern;Decreased step length - right;Decreased stance time - right;Antalgic  Gait velocity slow  Stairs No  Exercises  Exercises Total Joint (hand out provided)  Total Joint Exercises  Ankle Circles/Pumps AROM;10 reps;Supine;Both  Quad Sets PROM;Right;10 reps;Supine (with 5 sec hold)  Heel Slides AAROM;Right;10 reps;Supine  PT - End of Session  Equipment Utilized During Treatment Gait belt  Activity Tolerance Patient limited by pain  Patient left in chair;with call bell/phone within reach  Nurse Communication Patient requests pain meds;Mobility status  PT Assessment  Clinical Impression Statement Pt s/p R TKA presenting with decreased R LE strength, R knee ROM, and increased R LE pain. Patient lives alone and has no support. patient to benefit from SNF to achieve safe I function prior to transition home.  PT Recommendation/Assessment Patient needs continued PT services  PT Problem List Decreased strength;Decreased range of motion;Decreased activity tolerance;Decreased balance  Barriers to Discharge None  PT Therapy Diagnosis  Difficulty walking;Generalized weakness;Abnormality of gait;Acute  pain  PT Plan  PT Frequency 7X/week  PT Treatment/Interventions DME instruction;Gait training;Stair training;Functional mobility training;Therapeutic activities;Therapeutic exercise   PT Recommendation  Follow Up Recommendations Skilled nursing facility  Equipment Recommended Defer to next venue  Individuals Consulted  Consulted and Agree with Results and Recommendations Patient  Acute Rehab PT Goals  PT Goal Formulation With patient  Time For Goal Achievement 08/26/11  Potential to Achieve Goals Good  Pt will go Supine/Side to Sit with modified independence;with HOB 0 degrees  PT Goal: Supine/Side to Sit - Progress Goal set today  Pt will go Sit to Stand with modified independence;with upper extremity assist  PT Goal: Sit to Stand - Progress Goal set today  Pt will Ambulate >150 feet;with rolling walker;with modified independence  PT Goal: Ambulate - Progress Goal set today  Pt will Perform Home Exercise Program Independently  PT Goal: Perform Home Exercise Program - Progress Goal set today  PT General Charges  $$ ACUTE PT VISIT 1 Procedure  PT Evaluation  $Initial PT Evaluation Tier II 1 Procedure  Written Expression  Dominant Hand Left     Pain: initially 9/10 in R knee upon arrival, 6/10 R knee pain s/p ambulation  Lewis Shock, PT, DPT Pager #: [phone] Office #: [phone]

## 2011-08-19 NOTE — Progress Notes (Signed)
CARE MANAGEMENT NOTE 08/19/2011  Patient:  [name] [name], [name] [name]   Account Number:  [id number]  Date Initiated:  08/19/2011  Documentation initiated by:  Vance Peper  Subjective/Objective Assessment:   56 yr old female s/p right total knee arthroplasty     Action/Plan:   CM spoke with patient regarding HH needs at discharge. She will be going to [facility] for shortterm rehab. Was preoperatively setup with [facility].   Anticipated DC Date:  08/21/2011   Anticipated DC Plan:  SKILLED NURSING FACILITY  In-house referral  Clinical Social Worker      DC Planning Services  CM consult      Choice offered to / List presented to:             Status of service:  Completed, signed off Discharge Disposition:  SKILLED NURSING FACILITY

## 2011-08-19 NOTE — Progress Notes (Signed)
Physical Therapy Treatment Note   08/19/11 1447  PT Visit Information  Last PT Received On 08/19/11  Assistance Needed +1  PT Time Calculation  PT Start Time 1447  PT Stop Time 1501  PT Time Calculation (min) 14 min  Subjective Data  Subjective Pt received sitting up in chair. PT focus on R LE ther ex.  Precautions  Precautions Knee  Restrictions  RLE Weight Bearing WBAT  Cognition  Overall Cognitive Status Appears within functional limits for tasks assessed/performed  Arousal/Alertness Awake/alert  Orientation Level Oriented X4 / Intact  Behavior During Session [facility] for tasks performed  Exercises  Exercises Total Joint  Total Joint Exercises  Ankle Circles/Pumps AROM;10 reps;Both;Seated  Quad Sets AROM;Right;10 reps;Seated  Heel Slides AROM;Right;20 reps;Seated (with towel on floor)  Short Arc Quad AROM;Right;10 reps;Seated  Straight Leg Raises AAROM;Right;20 reps;Seated  Long Arc Quad AROM;Right;20 reps;Seated  PT - End of Session  Activity Tolerance Patient tolerated treatment well  Patient left in chair;with call bell/phone within reach  PT - Assessment/Plan  Comments on Treatment Session PT focus on R knee ther ex. Patient progressing well with flexion and quad strength  PT Plan Discharge plan remains appropriate;Frequency remains appropriate  PT Frequency 7X/week  Follow Up Recommendations Skilled nursing facility  Equipment Recommended Defer to next venue  Acute Rehab PT Goals  Time For Goal Achievement 08/26/11  Potential to Achieve Goals Good  PT Goal: Perform Home Exercise Program - Progress Progressing toward goal  PT General Charges  $$ ACUTE PT VISIT 1 Procedure  PT Treatments  $Therapeutic Exercise 8-22 mins    Pain: 7/10 R knee pain  Lewis Shock, PT, DPT Pager #: [phone] Office #: [phone]

## 2011-08-19 NOTE — Progress Notes (Signed)
Order received, chart reviewed, spoke to PT who reports they are recommending SNF and pt is Medicare, will defer OT eval to SNF. If D/C plan should change, then eval will be completed.  Ignacia Palma, [place] [phone] 08/19/2011

## 2011-08-19 NOTE — Progress Notes (Signed)
ANTICOAGULATION CONSULT NOTE - Follow Up Consult  Pharmacy Consult for Warfarin Indication: VTE prophylaxis s/p R TKA on 6/12  Allergies  Allergen Reactions  . Tetracycline     REACTION: hepatitis    Patient Measurements: Height: 5\' 1"  (154.9 cm) Weight: 227 lb (102.967 kg) IBW/kg (Calculated) : 47.8    Vital Signs: Temp: 98.1 F (36.7 C) (06/13 0602) Temp src: Oral (06/12 2202) BP: 114/74 mmHg (06/13 0602) Pulse Rate: 62  (06/13 0602)  Labs:  Basename 08/19/11 0610 08/18/11 0731  HGB 9.5* --  HCT 30.5* --  PLT 344 --  APTT -- --  LABPROT 14.0 --  INR 1.06 --  HEPARINUNFRC -- --  CREATININE 0.97 1.22*  CKTOTAL -- --  CKMB -- --  TROPONINI -- --    Estimated Creatinine Clearance: 70.6 ml/min (by C-G formula based on Cr of 0.97).   Assessment: 56 y.o. F on warfarin for VTE prophylaxis s/p R TKA on 6/12 with a SUBtherapeutic INR this a.m (1.06, goal of 2-3). Hgb/Hct/Plt drop from pre-surgery labs -- some blood noted on dressing site. No other s/sx of bleeding noted at this time. The patient was educated on warfarin today.   Goal of Therapy:  INR 2-3 Monitor platelets by anticoagulation protocol: Yes   Plan:  1. Warfarin 7.5 mg x 1 dose at 1800 today 2. Will continue to monitor for any signs/symptoms of bleeding and will follow up with PT/INR in the a.m.   Georgina Pillion, PharmD, BCPS Clinical Pharmacist Pager: [phone] 08/19/2011 8:51 AM

## 2011-08-19 NOTE — Progress Notes (Signed)
Plan d/c to [facility] on Saturday, 08/21/11 if stable per MD.  Sherron Monday with [facility],, Admissions Director of SNF. Bed will be available on 08/21/11.  Discussed with patient.   Lorri Frederick. [place]  [phone]

## 2011-08-19 NOTE — Progress Notes (Signed)
Dressing changed and hemovac D/C'd per verbal order.  Hemovac catheter intact.  allevyn dressing applied to drain site and gauze/abd dressing applied to incision site with thigh TEDS.  Staples intact and incision well approximated.  Bruising noted to lateral knee.  Pt placed in CPM 0-70 degrees at this time.

## 2011-08-19 NOTE — Clinical Social Work Placement (Addendum)
    Clinical Social Work Department CLINICAL SOCIAL WORK PLACEMENT NOTE 08/19/2011  Patient:  [name] [name], [name] [name]  Account Number:  [id number] Admit date:  08/18/2011  Clinical Social Worker:  Lupita Leash Avish Torry, BSW  Date/time:  08/18/2011 03:15 PM  Clinical Social Work is seeking post-discharge placement for this patient at the following level of care:   SKILLED NURSING   (*CSW will update this form in Epic as items are completed)     Patient/family provided with [facility] Department of Clinical Social Work's list of facilities offering this level of care within the geographic area requested by the patient (or if unable, by the patient's family).    Patient/family informed of their freedom to choose among providers that offer the needed level of care, that participate in Medicare, Medicaid or managed care program needed by the patient, have an available bed and are willing to accept the patient.    Patient/family informed of [facility]' ownership interest in [facility], as well as of the fact that they are under no obligation to receive care at this facility.  PASARR submitted to EDS on 08/19/2011 PASARR number received from EDS on   FL2 transmitted to all facilities in geographic area requested by pt/family on  08/18/2011 FL2 transmitted to all facilities within larger geographic area on   Patient informed that his/her managed care company has contracts with or will negotiate with  certain facilities, including the following:   Patient has Medicare.  She deferred receiving SNF list as she pre-chose [facility].     Patient/family informed of bed offers received:  08/19/2011 Patient chooses bed at [facility] Physician recommends and patient chooses bed at    Patient to be transferred to [facility] on  08/21/2011 Patient to be transferred to facility by [facility]  The following physician request were entered in Epic:   Additional Comments:

## 2011-08-20 ENCOUNTER — Encounter (HOSPITAL_COMMUNITY): Payer: Self-pay | Admitting: Orthopedic Surgery

## 2011-08-20 ENCOUNTER — Inpatient Hospital Stay (HOSPITAL_COMMUNITY): Payer: Medicare Other

## 2011-08-20 LAB — CBC
HCT: 29.2 % — ABNORMAL LOW (ref 36.0–46.0)
Hemoglobin: 9.2 g/dL — ABNORMAL LOW (ref 12.0–15.0)
RBC: 3.15 MIL/uL — ABNORMAL LOW (ref 3.87–5.11)
RDW: 14.1 % (ref 11.5–15.5)
WBC: 10.6 10*3/uL — ABNORMAL HIGH (ref 4.0–10.5)

## 2011-08-20 LAB — BASIC METABOLIC PANEL
BUN: 18 mg/dL (ref 6–23)
Chloride: 100 mEq/L (ref 96–112)
GFR calc Af Amer: 69 mL/min — ABNORMAL LOW (ref >90)
Glucose, Bld: 139 mg/dL — ABNORMAL HIGH (ref 70–99)
Potassium: 4.3 mEq/L (ref 3.5–5.1)

## 2011-08-20 LAB — GLUCOSE, CAPILLARY: Glucose-Capillary: 197 mg/dL — ABNORMAL HIGH (ref 70–99)

## 2011-08-20 LAB — PROTIME-INR: INR: 1.12 (ref 0.00–1.49)

## 2011-08-20 MED ORDER — ENOXAPARIN SODIUM 30 MG/0.3ML ~~LOC~~ SOLN: 30.0000 mg | Freq: Two times a day (BID) | SUBCUTANEOUS | Status: DC

## 2011-08-20 MED ORDER — WARFARIN SODIUM 5 MG PO TABS: 5.0000 mg | ORAL_TABLET | Freq: Every day | ORAL | Status: DC

## 2011-08-20 MED ORDER — WARFARIN SODIUM 10 MG PO TABS
10.0000 mg | ORAL_TABLET | Freq: Once | ORAL | Status: AC
  Administered 2011-08-20: 10 mg via ORAL
  Filled 2011-08-20: qty 1

## 2011-08-20 MED ORDER — HYDROCODONE-ACETAMINOPHEN 5-325 MG PO TABS
1.0000 | ORAL_TABLET | ORAL | Status: DC | PRN
  Administered 2011-08-21 (×2): 1 via ORAL
  Filled 2011-08-20: qty 2
  Filled 2011-08-20: qty 1

## 2011-08-20 MED ORDER — HYDROCODONE-ACETAMINOPHEN 5-325 MG PO TABS: 1.0000 | ORAL_TABLET | ORAL | Status: AC | PRN

## 2011-08-20 NOTE — Progress Notes (Signed)
Physical Therapy Treatment Note   08/20/11 1437  PT Visit Information  Last PT Received On 08/20/11  Assistance Needed +1  PT Time Calculation  PT Start Time 1437  PT Stop Time 1456  PT Time Calculation (min) 19 min  Subjective Data  Subjective Pt received sitting EOB with c/o 5/10 R knee pain. 7/10 R knee pain with mvmt  Precautions  Precautions Knee  Required Braces or Orthoses Knee Immobilizer - Right  Knee Immobilizer - Right On when out of bed or walking  Restrictions  RLE Weight Bearing WBAT  Cognition  Overall Cognitive Status Appears within functional limits for tasks assessed/performed  Arousal/Alertness Awake/alert  Orientation Level Oriented X4 / Intact  Behavior During Session [facility] for tasks performed  Bed Mobility  Bed Mobility Not assessed  Transfers  Transfers Sit to Stand;Stand to Sit  Sit to Stand 4: Min guard;With armrests;With upper extremity assist;From bed  Stand to Sit 4: Min guard;With armrests;To chair/3-in-1  Details for Transfer Assistance v/c's for hand placement  Ambulation/Gait  Ambulation/Gait Assistance 4: Min guard  Ambulation Distance (Feet) 50 Feet  Assistive device Rolling walker  Ambulation/Gait Assistance Details encouraged increased R LE WBing  Gait Pattern Step-to pattern;Decreased step length - right;Decreased stance time - right;Antalgic  Gait velocity slow  Stairs No  Total Joint Exercises  Quad Sets AROM;Right;10 reps;Seated  Heel Slides AROM;Right;20 reps;Seated (10 PROM R knee flex to 80 deg)  Long Arc Quad AROM;Right;20 reps;Seated  Knee Flexion AAROM;Right;10 reps;Seated (with LEs elevated)  PT - End of Session  Equipment Utilized During Treatment Gait belt  Activity Tolerance Patient tolerated treatment well  Patient left in chair;with call bell/phone within reach  Nurse Communication Mobility status  PT - Assessment/Plan  Comments on Treatment Session Pt with c/o of nausea this AM. Patient with improved ambulation  tolerance this date with R KI.  PT Plan Discharge plan remains appropriate;Frequency remains appropriate  PT Frequency 7X/week  Follow Up Recommendations Skilled nursing facility  Equipment Recommended Defer to next venue  Acute Rehab PT Goals  Time For Goal Achievement 08/26/11  Potential to Achieve Goals Good  PT Goal: Sit to Stand - Progress Progressing toward goal  PT Goal: Ambulate - Progress Progressing toward goal  PT Goal: Perform Home Exercise Program - Progress Progressing toward goal  PT General Charges  $$ ACUTE PT VISIT 1 Procedure  PT Treatments  $Gait Training 8-22 mins    Lewis Shock, PT, DPT Pager #: [phone] Office #: [phone]

## 2011-08-20 NOTE — Progress Notes (Signed)
Subjective: C/o nausea and vomiting yesterday and this morning.  Somewhat drowsy.  Denies cp, sob, abd pain.    Objective: Vital signs in last 24 hours: Temp:  [98.3 F (36.8 C)-99.7 F (37.6 C)] 99.1 F (37.3 C) (06/14 0618) Pulse Rate:  [90-111] 90  (06/14 0618) Resp:  [16-18] 16  (06/14 1159) BP: (97-148)/(61-90) 120/78 mmHg (06/14 0618) SpO2:  [93 %-99 %] 93 % (06/14 1159)  Intake/Output from previous day: 06/13 0701 - 06/14 0700 In: 1080 [P.O.:1080] Out: 600 [Urine:600] Intake/Output this shift:     Basename 08/20/11 0615 08/19/11 0610  HGB 9.2* 9.5*    Basename 08/20/11 0615 08/19/11 0610  WBC 10.6* 10.4  RBC 3.15* 3.27*  HCT 29.2* 30.5*  PLT 321 344    Basename 08/20/11 0615 08/19/11 0610  NA 137 140  K 4.3 4.5  CL 100 102  CO2 28 25  BUN 18 22  CREATININE 1.03 0.97  GLUCOSE 139* 163*  CALCIUM 8.9 9.0    Basename 08/20/11 0615 08/19/11 0610  LABPT -- --  INR 1.12 1.06    Exam:  Wound looks good.  Staples intact.  No drainage or signs of infection.  Drain removed.    Assessment/Plan: Change percocet to norco.  Will get kub.  Anticipate transfer to [facility] tomorrow.     Tynan Boesel M 08/20/2011, 12:39 PM

## 2011-08-20 NOTE — Progress Notes (Signed)
ANTICOAGULATION CONSULT NOTE - Follow Up Consult  Pharmacy Consult for Warfarin Indication: VTE prophylaxis s/p R TKA on 6/12  Allergies  Allergen Reactions  . Tetracycline     REACTION: hepatitis    Patient Measurements: Height: 5\' 1"  (154.9 cm) Weight: 227 lb (102.967 kg) IBW/kg (Calculated) : 47.8    Vital Signs: Temp: 99.1 F (37.3 C) (06/14 0618) Temp src: Oral (06/14 0618) BP: 120/78 mmHg (06/14 0618) Pulse Rate: 90  (06/14 0618)  Labs:  Basename 08/20/11 0615 08/19/11 0610 08/18/11 0731  HGB 9.2* 9.5* --  HCT 29.2* 30.5* --  PLT 321 344 --  APTT -- -- --  LABPROT 14.6 14.0 --  INR 1.12 1.06 --  HEPARINUNFRC -- -- --  CREATININE 1.03 0.97 1.22*  CKTOTAL -- -- --  CKMB -- -- --  TROPONINI -- -- --    Estimated Creatinine Clearance: 66.5 ml/min (by C-G formula based on Cr of 1.03).   Assessment: 56 y.o. F on warfarin for VTE prophylaxis s/p R TKA on 6/12 with a SUBtherapeutic INR this a.m (1.12 << 1.06, goal of 2-3). Hgb/Hct/Plt slight drop but appear to be stabilizing. Bruising noted per MD around lateral knee -- no other s/sx of bleeding noted at this time. The patient was educated on warfarin this admission.   Goal of Therapy:  INR 2-3 Monitor platelets by anticoagulation protocol: Yes   Plan:  1. Warfarin 10 mg x 1 dose at 1800 today 2. Will continue to monitor for any signs/symptoms of bleeding and will follow up with PT/INR in the a.m.   Georgina Pillion, PharmD, BCPS Clinical Pharmacist Pager: [phone] 08/20/2011 9:32 AM

## 2011-08-20 NOTE — Progress Notes (Signed)
Orthopedic Tech Progress Note Patient Details:  [name] [name] 11/09/1955 [id number]  Patient ID: [name], female   DOB: 1955-11-15, 56 y.o.   MRN: [id number] Patient has knee immobilizer.  Javaria Knapke T 08/20/2011, 8:11 AM

## 2011-08-20 NOTE — Discharge Summary (Signed)
[name] [name], [name] [name] NO.:  [id number]  MEDICAL RECORD NO.:  [id number]  LOCATION:  5011                         FACILITY:  [facility]  PHYSICIAN:  Loreta Ave, M.D. DATE OF BIRTH:  Sep 08, 1955  DATE OF ADMISSION:  08/18/2011 DATE OF DISCHARGE:                              DISCHARGE SUMMARY   FINAL DIAGNOSES: 1. Status post right total knee replacement for end-stage degenerative     joint disease. 2. Diabetes. 3. Hyperlipidemia. 4. Vitamin D deficiency. 5. Obesity. 6. Peripheral edema. 7. Lumbar degenerative disk disease. 8. Depression. 9. Gastroesophageal reflux disease. 10.Hypertension. 11.Gout. 12.Peripheral neuropathy.  HISTORY OF PRESENT ILLNESS:  A 56 year old white female with history of end-stage DJD right knee and chronic pain presented to our office for preop evaluation for total knee replacement.  She had progressively worsening pain with failed to response with conservative treatment. Significant decrease in her daily activities due to the ongoing complaint.  HOSPITAL COURSE:  On August 18, 2011, the patient was taken to the [facility] OR and a right total knee replacement procedure performed.  SURGEON:  Loreta Ave, M.D.  ASSISTANT:  Genene Churn. Denton Meek.  ANESTHESIA:  General with femoral nerve block.  ESTIMATED BLOOD LOSS:  Minimal blood loss.  CULTURES:  No labs specimens or cultures.  COUNTS:  All counts correct.  TOURNIQUET TIME:  66 minutes.  There were no surgical or anesthesia complications and the patient was transferred to recovery in stable condition.  After arriving to the orthopedic unit, pharmacy protocol of Coumadin and Lovenox started for DVT prophylaxis.  On August 19, 2011, the patient doing well with good pain control.  We are planning transfer to [facility] for rehab. Hemoglobin 9.5, hematocrit 30.5.  Sodium 140, potassium 4.5, chloride 102, CO2 of 25, BUN 22, creatinine 0.97, glucose 163, INR 1.06.  She  had some slight bleeding through her dressing.  Ordered dressing change. Calf nontender.  Neurovascular intact.  Skin warm and dry. PT/OT consults.  On August 20, 2011, the patient was somewhat drowsy this morning.  Also complaining of nausea and vomiting yesterday.  The patient states that she does get some nausea after injection of her one of her diabetes meds.  We will discontinue Percocet and try Norco 5/325 for pain.  We will order KUB to rule out postop ileus.  Anticipate transfer to [facility] Saturday if she is doing well.  Knee wound looks good and staples intact.  No drainage or signs infection.  Hemovac drain removed.  Calf nontender.  DISCHARGE MEDICATIONS:  See AVS for detailed list.  CONDITION:  Good and stable.  DISPOSITION:  Transfer to [facility] for rehab.  INSTRUCTIONS:  While at [facility], the patient will work with PT and OT to improve ambulation and knee range of motion and strengthening. Weight bear as tolerated with walker and can wean to a cane as tolerated.  CPM 0-70 degrees 6-8 hours daily and increase by 10 degrees daily as tolerated.  Okay to shower, but no tub soaking.  Daily dressing changes with 4 x 4 gauze and apply TED hose over this.  Do not apply any creams or ointments to her  incision.  Coumadin times 3-4 weeks postop for DVT prophylaxis and discontinue Lovenox when Coumadin is therapeutic with INR 2-3.  Needs return office visit with Dr. Eulah Pont 2 weeks postop for recheck.  If there are any questions or concerns regarding her knee, contact our office immediately at [phone].     Genene Churn. Denton Meek.   ______________________________ Loreta Ave, M.D.    JMO/MEDQ  D:  08/20/2011  T:  08/20/2011  Job:  [id number]

## 2011-08-21 LAB — GLUCOSE, CAPILLARY: Glucose-Capillary: 125 mg/dL — ABNORMAL HIGH (ref 70–99)

## 2011-08-21 LAB — CBC
HCT: 27.9 % — ABNORMAL LOW (ref 36.0–46.0)
MCV: 90.3 fL (ref 78.0–100.0)
RDW: 14.2 % (ref 11.5–15.5)
WBC: 11 10*3/uL — ABNORMAL HIGH (ref 4.0–10.5)

## 2011-08-21 LAB — BASIC METABOLIC PANEL
BUN: 18 mg/dL (ref 6–23)
Chloride: 101 mEq/L (ref 96–112)
Creatinine, Ser: 1.18 mg/dL — ABNORMAL HIGH (ref 0.50–1.10)
GFR calc Af Amer: 58 mL/min — ABNORMAL LOW (ref >90)

## 2011-08-21 MED ORDER — WARFARIN SODIUM 5 MG PO TABS
5.0000 mg | ORAL_TABLET | Freq: Once | ORAL | Status: DC
  Filled 2011-08-21: qty 1

## 2011-08-21 NOTE — Progress Notes (Signed)
Physical Therapy Treatment Patient Details Name: [name] [name] MRN: [id number] DOB: 1956/02/21 Today's Date: 08/21/2011 Time: 8413-2440 PT Time Calculation (min): 18 min  PT Assessment / Plan / Recommendation Comments on Treatment Session  Pt's O2 sats dropped from 98% on room air at rest, to 83% on Room air with HR of 137 while ambulating. Pt rested 3 minutes.  HR then at 114 and O2 sats at 85.  Placed pt on 2L O2 via [place] and sats quickly retuned to 96%.    Follow Up Recommendations  Skilled nursing facility    Barriers to Discharge        Equipment Recommendations  Defer to next venue    Recommendations for Other Services    Frequency 7X/week   Plan Discharge plan remains appropriate;Frequency remains appropriate    Precautions / Restrictions Precautions Precautions: Knee Required Braces or Orthoses: Knee Immobilizer - Right Knee Immobilizer - Right: On when out of bed or walking Restrictions Weight Bearing Restrictions: No RLE Weight Bearing: Weight bearing as tolerated   Pertinent Vitals/Pain Pt reports pain in Right knee is 0/10 at rest and 5/10 when ambulating. Pt was medicated just prior to PT.    Mobility  Bed Mobility Bed Mobility: Not assessed Transfers Transfers: Sit to Stand;Stand to Sit Sit to Stand: 5: Supervision;From chair/3-in-1;With upper extremity assist Stand to Sit: 4: Min guard;With armrests;To chair/3-in-1 Details for Transfer Assistance: Cues for positioning of R LE to minimize pain.  Ambulation/Gait Ambulation/Gait Assistance: 5: Supervision Ambulation Distance (Feet): 75 Feet Assistive device: Rolling walker Ambulation/Gait Assistance Details: Pt gait much improved.  Pain in R knee increased to 5/10 when ambulating. HR increased to 137 and O2 sats dropped to 83 on room air .  Gait Pattern: Step-to pattern;Decreased step length - right;Decreased stance time - right;Antalgic Gait velocity: slow Stairs: No    Exercises Total Joint  Exercises Ankle Circles/Pumps: AROM;10 reps;Both;Seated Quad Sets: AROM;Right;10 reps;Seated   PT Diagnosis:    PT Problem List:   PT Treatment Interventions:     PT Goals Acute Rehab PT Goals PT Goal Formulation: With patient Time For Goal Achievement: 08/26/11 Potential to Achieve Goals: Good Pt will go Sit to Stand: with modified independence;with upper extremity assist PT Goal: Sit to Stand - Progress: Progressing toward goal Pt will Ambulate: >150 feet;with rolling walker;with modified independence PT Goal: Ambulate - Progress: Progressing toward goal Pt will Perform Home Exercise Program: Independently PT Goal: Perform Home Exercise Program - Progress: Not met  Visit Information  Last PT Received On: 08/21/11 Assistance Needed: +1    Subjective Data  Subjective: My oxygen was low yesterday.    Cognition  Overall Cognitive Status: Appears within functional limits for tasks assessed/performed Arousal/Alertness: Awake/alert Orientation Level: Oriented X4 / Intact Behavior During Session: [facility] for tasks performed    Balance  Balance Balance Assessed: No  End of Session PT - End of Session Equipment Utilized During Treatment: Gait belt Activity Tolerance: Patient tolerated treatment well Patient left: in chair;with call bell/phone within reach Nurse Communication: Mobility status    Mckenna Boruff 08/21/2011, 1:18 PM Sonyia Muro L. Kaiyden Simkin DPT [phone]

## 2011-08-21 NOTE — Progress Notes (Signed)
  Georgena Spurling, MD   Altamese Cabal, PA-C [address] [place], [place], [place]  [address]                             [phone]   PROGRESS NOTE  Subjective:  negative for Chest Pain  negative for Shortness of Breath  negative for Nausea/Vomiting   negative for Calf Pain  negative for Bowel Movement   Tolerating Diet: yes         Patient reports pain as 5 on 0-10 scale.    Objective: Vital signs in last 24 hours:   Patient Vitals for the past 24 hrs:  BP Temp Pulse Resp SpO2  08/21/11 0656 148/92 mmHg 97.4 F (36.3 C) 112  18  98 %  08/20/11 2315 150/89 mmHg 99 F (37.2 C) 110  18  93 %  08/20/11 1600 - - - 16  93 %  08/20/11 1400 176/98 mmHg 98.5 F (36.9 C) 116  20  92 %  08/20/11 1159 - - - 16  93 %    @flow {1959:LAST@   Intake/Output from previous day:   06/14 0701 - 06/15 0700 In: 360 [P.O.:360] Out: 600 [Urine:600]   Intake/Output this shift:       Intake/Output      06/14 0701 - 06/15 0700 06/15 0701 - 06/16 0700   P.O. 360    Total Intake(mL/kg) 360 (3.5)    Urine (mL/kg/hr) 600 (0.2)    Total Output 600    Net -240         Urine Occurrence 1 x       LABORATORY DATA:  Basename 08/21/11 0530 08/20/11 0615 08/19/11 0610  WBC 11.0* 10.6* 10.4  HGB 8.7* 9.2* 9.5*  HCT 27.9* 29.2* 30.5*  PLT 309 321 344    Basename 08/21/11 0530 08/20/11 0615 08/19/11 0610 08/18/11 0731  NA 139 137 140 139  K 4.3 4.3 4.5 4.1  CL 101 100 102 101  CO2 28 28 25 26   BUN 18 18 22  35*  CREATININE 1.18* 1.03 0.97 1.22*  GLUCOSE 136* 139* 163* 133*  CALCIUM 8.8 8.9 9.0 9.4   Lab Results  Component Value Date   INR 1.51* 08/21/2011   INR 1.12 08/20/2011   INR 1.06 08/19/2011    Examination:  General appearance: alert, cooperative and no distress Extremities: Homans sign is negative, no sign of DVT  Wound Exam: clean, dry, intact   Drainage:  None: wound tissue dry  Motor Exam: EHL and FHL Intact  Sensory Exam: Deep Peroneal normal  Vascular Exam:     Assessment:    3 Days Post-Op  Procedure(s) (LRB): TOTAL KNEE ARTHROPLASTY (Right)  ADDITIONAL DIAGNOSIS:  Active Problems:  * No active hospital problems. *   Acute Blood Loss Anemia   Plan: Physical Therapy as ordered Weight Bearing as Tolerated (WBAT)  DVT Prophylaxis:  Lovenox and Coumadin  DISCHARGE PLAN: Skilled Nursing Facility/Rehab  DISCHARGE NEEDS: HHPT, CPM, Walker and 3-in-1 comode seat         Rasheen Bells 08/21/2011, 8:51 AM

## 2011-08-21 NOTE — Progress Notes (Signed)
Pt transportation to [facility] via [facility]tar.  Facility and pt both expecting transfer today.

## 2011-08-21 NOTE — Progress Notes (Signed)
ANTICOAGULATION CONSULT NOTE - Follow Up Consult  Pharmacy Consult forCoumadin Indication:VTE prophylaxis s/p R TKA on 6/12   Allergies  Allergen Reactions  . Tetracycline     REACTION: hepatitis    Patient Measurements: Height: 5\' 1"  (154.9 cm) Weight: 227 lb (102.967 kg) IBW/kg (Calculated) : 47.8    Vital Signs: Temp: 97.4 F (36.3 C) (06/15 0656) BP: 148/92 mmHg (06/15 0656) Pulse Rate: 112  (06/15 0656)  Labs:  Basename 08/21/11 0530 08/20/11 0615 08/19/11 0610  HGB 8.7* 9.2* --  HCT 27.9* 29.2* 30.5*  PLT 309 321 344  APTT -- -- --  LABPROT 18.5* 14.6 14.0  INR 1.51* 1.12 1.06  HEPARINUNFRC -- -- --  CREATININE 1.18* 1.03 0.97  CKTOTAL -- -- --  CKMB -- -- --  TROPONINI -- -- --    Estimated Creatinine Clearance: 58 ml/min (by C-G formula based on Cr of 1.18).   Assessment: 56 y.o. F on warfarin for VTE prophylaxis s/p R TKA on 6/12 with a SUBtherapeutic INR this a.m (1.12 << 1.06, goal of 2-3). Hgb/Hct/Plt slight drop but appear to be stabilizing. Bruising noted per MD around lateral knee -- no other s/sx of bleeding noted at this time. The patient was educated on warfarin this admission INR 1.51 this AM after 10 mg dose large increase  Goal of Therapy:  INR 2-3 Monitor platelets by anticoagulation protocol: Yes   Plan:  Warfarin 5 mg po today  continue to monitor for any signs/symptoms of bleeding and will follow up with PT/INR in the a.m.   [name] [name] 08/21/2011,1:03 PM

## 2011-08-23 LAB — GLUCOSE, CAPILLARY: Glucose-Capillary: 98 mg/dL (ref 70–99)

## 2011-11-23 ENCOUNTER — Encounter (HOSPITAL_COMMUNITY): Payer: Self-pay | Admitting: Pharmacy Technician

## 2011-11-24 ENCOUNTER — Encounter (HOSPITAL_COMMUNITY): Payer: Self-pay

## 2011-11-24 ENCOUNTER — Encounter (HOSPITAL_COMMUNITY)
Admission: RE | Admit: 2011-11-24 | Discharge: 2011-11-24 | Disposition: A | Discharge status: Home / Self Care | Payer: Medicare Other | Source: Ambulatory Visit | Attending: Orthopedic Surgery | Admitting: Orthopedic Surgery

## 2011-11-24 HISTORY — DX: Other specified postprocedural states: Z98.890

## 2011-11-24 HISTORY — DX: Other specified postprocedural states: R11.2

## 2011-11-24 HISTORY — DX: Adverse effect of unspecified anesthetic, initial encounter: T41.45XA

## 2011-11-24 HISTORY — DX: Low back pain: M54.5

## 2011-11-24 HISTORY — DX: Other chronic pain: G89.29

## 2011-11-24 HISTORY — DX: Major depressive disorder, single episode, unspecified: F32.9

## 2011-11-24 HISTORY — DX: Inflammatory liver disease, unspecified: K75.9

## 2011-11-24 HISTORY — DX: Gastro-esophageal reflux disease without esophagitis: K21.9

## 2011-11-24 HISTORY — DX: Unspecified osteoarthritis, unspecified site: M19.90

## 2011-11-24 HISTORY — DX: Low back pain, unspecified: M54.50

## 2011-11-24 HISTORY — DX: Depression, unspecified: F32.A

## 2011-11-24 HISTORY — DX: Disorder of the skin and subcutaneous tissue, unspecified: L98.9

## 2011-11-24 HISTORY — DX: Other complications of anesthesia, initial encounter: T88.59XA

## 2011-11-24 HISTORY — DX: Bronchitis, not specified as acute or chronic: J40

## 2011-11-24 HISTORY — DX: Unspecified cataract: H26.9

## 2011-11-24 LAB — URINALYSIS, ROUTINE W REFLEX MICROSCOPIC
Bilirubin Urine: NEGATIVE
Glucose, UA: NEGATIVE mg/dL
Hgb urine dipstick: NEGATIVE
Protein, ur: NEGATIVE mg/dL

## 2011-11-24 LAB — COMPREHENSIVE METABOLIC PANEL
ALT: 24 U/L (ref 0–35)
Alkaline Phosphatase: 127 U/L — ABNORMAL HIGH (ref 39–117)
BUN: 32 mg/dL — ABNORMAL HIGH (ref 6–23)
CO2: 28 mEq/L (ref 19–32)
Chloride: 99 mEq/L (ref 96–112)
GFR calc Af Amer: 72 mL/min — ABNORMAL LOW (ref >90)
GFR calc non Af Amer: 62 mL/min — ABNORMAL LOW (ref >90)
Glucose, Bld: 152 mg/dL — ABNORMAL HIGH (ref 70–99)
Potassium: 4.8 mEq/L (ref 3.5–5.1)
Sodium: 138 mEq/L (ref 135–145)
Total Bilirubin: 0.1 mg/dL — ABNORMAL LOW (ref 0.3–1.2)
Total Protein: 7.7 g/dL (ref 6.0–8.3)

## 2011-11-24 LAB — CBC
HCT: 37.9 % (ref 36.0–46.0)
Hemoglobin: 12.2 g/dL (ref 12.0–15.0)
RBC: 4.27 MIL/uL (ref 3.87–5.11)
WBC: 10.9 10*3/uL — ABNORMAL HIGH (ref 4.0–10.5)

## 2011-11-24 LAB — APTT: aPTT: 30 seconds (ref 24–37)

## 2011-11-24 LAB — PROTIME-INR: Prothrombin Time: 13 seconds (ref 11.6–15.2)

## 2011-11-24 LAB — TYPE AND SCREEN: Antibody Screen: NEGATIVE

## 2011-11-24 LAB — SURGICAL PCR SCREEN: MRSA, PCR: NEGATIVE

## 2011-11-24 NOTE — H&P (Signed)
  [facility] [address]   [address] [place], [place] [address] [phone] A Division of [facility]  Loreta Ave, M.D.   Robert A. Thurston Hole, M.D.   Burnell Blanks, M.D.   Eulas Post, M.D.   Lunette Stands, M.D Buford Dresser, M.D.  Charlsie Quest, M.D.   Estell Harpin, M.D.   Melina Fiddler, M.D. Genene Churn. Barry Dienes, PA-C            Kirstin A. Shepperson, PA-C Josh Linton Hall, PA-C [place], [place]   RE: [name] [name], [name] [name]                                [phone]      DOB: [birth year] PROGRESS NOTE: 11-18-11 Chief complaint: Left knee pain.  History of present illness: 56 year-old white female with a history of end stage DJD, left knee, and chronic pain.  Returns.  States that knee symptoms are unchanged from previous visit.  She is wanting to proceed with total knee replacement as scheduled.  She is status post right total knee replacement in June of 2013 and this is doing very well.  She will be going back to [facility] for rehab post-op.   Current medications: Glucosamine Chondroitin, fish oil, Co-Q10, Calcium, Flexeril, Vitamin D, Pantoprazole, Lisinopril/HCTZ, Voltaren, Venlafaxine, Glipizide, Gabapentin, Lasix, Aspirin and Byetta. Allergies: Tetracycline with question of hepatitis. Past medical/surgical history: Diabetes, hyperlipidemia, Vitamin D deficiency, obesity, peripheral edema, lumbar degenerative disc disease, right total knee replacement, depression, GERD, hypertension, gout and peripheral neuropathy. Review of systems: Patient currently denies lightheadedness, dizziness, fevers, chills, cardiac, pulmonary, GI, GU or neuro issues. Family history: Positive for heart disease, hypertension and cancer.   Social history: Patient quit smoking in 1988 and denies alcohol use.  She is divorced and currently on disability.     EXAMINATION: Height: 4?10.  Weight: 235 pounds.  Blood pressure: 119/75.   Pulse: 104.  Temperature: 97.8.  Pleasant white female, alert and oriented x 3 and in no acute distress.  No increase in respiratory effort.  Head is normocephalic, a traumatic.  PERRLA, EOMI.  Cervical spine unremarkable.  Lungs: CTA bilaterally.  No wheezes.  Heart: RR, tachycardic.  S1 and S2.  No murmurs.  Abdomen: Round and non-tender.  NBS x 4.  Left knee: Decreased range of motion.  Positive crepitus.  Joint line tender.  Positive effusion.  Ligaments stable.  Calf non-tender.  Neurovascularly intact.  Skin warm and dry.    X-RAYS: Left knee, AP, lateral and sunrise views, show end stage DJD and periarticular spurs.    IMPRESSION: End stage DJD, left knee, and chronic pain.  Failed conservative treatment.    PLAN: We will proceed with left total knee replacement as scheduled.  Surgical procedure, along with potential rehab/recovery time discussed.  All questions answered.  At the time of her surgery we may do some gentle manipulation of her right knee if needed.    Genene Churn. Barry Dienes, PA-C   Electronically verified by Loreta Ave, M.D. JMO:jjh D 11-22-11 T 11-22-11

## 2011-11-24 NOTE — Pre-Procedure Instructions (Signed)
20 [name] [name]  11/24/2011   Your procedure is scheduled on:  Wednesday December 01, 2011  Report to [facility] at 9:15 AM.  Call this number if you have problems the morning of surgery: [phone]   Remember:   Do not eat food or drink:After Midnight.      Take these medicines the morning of surgery with A SIP OF WATER: albuterol, zyrtec, gabapentin, uloric, vicodin, effexor, protonix   Do not wear jewelry, make-up or nail polish.  Do not wear lotions, powders, or perfumes. You may wear deodorant.  Do not shave 48 hours prior to surgery. Men may shave face and neck.  Do not bring valuables to the hospital.  Contacts, dentures or bridgework may not be worn into surgery.  Leave suitcase in the car. After surgery it may be brought to your room.  For patients admitted to the hospital, checkout time is 11:00 AM the day of discharge.   Patients discharged the day of surgery will not be allowed to drive home.  Name and phone number of your driver: family / friend  Special Instructions: Incentive Spirometry - Practice and bring it with you on the day of surgery. and CHG Shower Shower 2 days before surgery and 1 day before surgery with Hibiclens.   Please read over the following fact sheets that you were given: Pain Booklet, Coughing and Deep Breathing, Blood Transfusion Information, Total Joint Packet, MRSA Information and Surgical Site Infection Prevention

## 2011-11-30 MED ORDER — CEFAZOLIN SODIUM-DEXTROSE 2-3 GM-% IV SOLR
2.0000 g | INTRAVENOUS | Status: AC
  Administered 2011-12-01: 2 g via INTRAVENOUS
  Filled 2011-11-30: qty 50

## 2011-12-01 ENCOUNTER — Inpatient Hospital Stay (HOSPITAL_COMMUNITY): Payer: Medicare Other

## 2011-12-01 ENCOUNTER — Encounter (HOSPITAL_COMMUNITY): Payer: Self-pay | Admitting: Certified Registered"

## 2011-12-01 ENCOUNTER — Inpatient Hospital Stay (HOSPITAL_COMMUNITY)
Admission: RE | Admit: 2011-12-01 | Discharge: 2011-12-04 | DRG: 470 | Disposition: A | Discharge status: Skilled Nursing Facility | Payer: Medicare Other | Source: Ambulatory Visit | Attending: Orthopedic Surgery | Admitting: Orthopedic Surgery

## 2011-12-01 ENCOUNTER — Encounter (HOSPITAL_COMMUNITY)
Admission: RE | Disposition: A | Discharge status: Skilled Nursing Facility | Payer: Self-pay | Source: Ambulatory Visit | Attending: Orthopedic Surgery

## 2011-12-01 ENCOUNTER — Inpatient Hospital Stay (HOSPITAL_COMMUNITY): Payer: Medicare Other | Admitting: Certified Registered"

## 2011-12-01 ENCOUNTER — Encounter (HOSPITAL_COMMUNITY): Payer: Self-pay | Admitting: *Deleted

## 2011-12-01 DIAGNOSIS — Z96659 Presence of unspecified artificial knee joint: Secondary | ICD-10-CM

## 2011-12-01 DIAGNOSIS — Z6841 Body Mass Index (BMI) 40.0 and over, adult: Secondary | ICD-10-CM

## 2011-12-01 DIAGNOSIS — Z87891 Personal history of nicotine dependence: Secondary | ICD-10-CM

## 2011-12-01 DIAGNOSIS — IMO0002 Reserved for concepts with insufficient information to code with codable children: Principal | ICD-10-CM | POA: Diagnosis present

## 2011-12-01 DIAGNOSIS — K219 Gastro-esophageal reflux disease without esophagitis: Secondary | ICD-10-CM | POA: Diagnosis present

## 2011-12-01 DIAGNOSIS — E119 Type 2 diabetes mellitus without complications: Secondary | ICD-10-CM | POA: Diagnosis present

## 2011-12-01 DIAGNOSIS — M1710 Unilateral primary osteoarthritis, unspecified knee: Principal | ICD-10-CM | POA: Diagnosis present

## 2011-12-01 DIAGNOSIS — E785 Hyperlipidemia, unspecified: Secondary | ICD-10-CM | POA: Diagnosis present

## 2011-12-01 HISTORY — PX: TOTAL KNEE ARTHROPLASTY: SHX125

## 2011-12-01 LAB — GLUCOSE, CAPILLARY: Glucose-Capillary: 136 mg/dL — ABNORMAL HIGH (ref 70–99)

## 2011-12-01 SURGERY — ARTHROPLASTY, KNEE, TOTAL
Anesthesia: General | Site: Knee | Laterality: Left | Wound class: Clean

## 2011-12-01 MED ORDER — FENTANYL CITRATE 0.05 MG/ML IJ SOLN
INTRAMUSCULAR | Status: DC | PRN
  Administered 2011-12-01 (×2): 50 ug via INTRAVENOUS

## 2011-12-01 MED ORDER — SENNOSIDES-DOCUSATE SODIUM 8.6-50 MG PO TABS: 1.0000 | ORAL_TABLET | Freq: Every evening | ORAL | Status: DC | PRN

## 2011-12-01 MED ORDER — WARFARIN VIDEO: Freq: Once | Status: DC

## 2011-12-01 MED ORDER — INSULIN ASPART 100 UNIT/ML ~~LOC~~ SOLN
0.0000 [IU] | Freq: Three times a day (TID) | SUBCUTANEOUS | Status: DC
  Administered 2011-12-01 – 2011-12-02 (×2): 3 [IU] via SUBCUTANEOUS
  Administered 2011-12-02 – 2011-12-04 (×6): 2 [IU] via SUBCUTANEOUS
  Administered 2011-12-04: 3 [IU] via SUBCUTANEOUS

## 2011-12-01 MED ORDER — LISINOPRIL-HYDROCHLOROTHIAZIDE 20-25 MG PO TABS: 1.0000 | ORAL_TABLET | Freq: Every day | ORAL | Status: DC

## 2011-12-01 MED ORDER — LORATADINE 10 MG PO TABS
10.0000 mg | ORAL_TABLET | Freq: Every day | ORAL | Status: DC
  Administered 2011-12-02 – 2011-12-04 (×3): 10 mg via ORAL
  Filled 2011-12-01 (×3): qty 1

## 2011-12-01 MED ORDER — DEXAMETHASONE SODIUM PHOSPHATE 4 MG/ML IJ SOLN
INTRAMUSCULAR | Status: DC | PRN
  Administered 2011-12-01: 4 mg

## 2011-12-01 MED ORDER — NALOXONE HCL 0.4 MG/ML IJ SOLN: 0.4000 mg | INTRAMUSCULAR | Status: DC | PRN

## 2011-12-01 MED ORDER — HYDROCHLOROTHIAZIDE 25 MG PO TABS
25.0000 mg | ORAL_TABLET | Freq: Every day | ORAL | Status: DC
  Administered 2011-12-01 – 2011-12-04 (×4): 25 mg via ORAL
  Filled 2011-12-01 (×4): qty 1

## 2011-12-01 MED ORDER — COUMADIN BOOK
Freq: Once | Status: AC
  Administered 2011-12-01: 18:00:00
  Filled 2011-12-01 (×2): qty 1

## 2011-12-01 MED ORDER — LISINOPRIL 20 MG PO TABS
20.0000 mg | ORAL_TABLET | Freq: Every day | ORAL | Status: DC
  Administered 2011-12-01: 20 mg via ORAL
  Filled 2011-12-01 (×2): qty 1

## 2011-12-01 MED ORDER — ALBUTEROL SULFATE HFA 108 (90 BASE) MCG/ACT IN AERS
2.0000 | INHALATION_SPRAY | Freq: Four times a day (QID) | RESPIRATORY_TRACT | Status: DC | PRN
  Filled 2011-12-01: qty 6.7

## 2011-12-01 MED ORDER — SODIUM CHLORIDE 0.9 % IJ SOLN: 9.0000 mL | INTRAMUSCULAR | Status: DC | PRN

## 2011-12-01 MED ORDER — BUPIVACAINE HCL (PF) 0.25 % IJ SOLN
INTRAMUSCULAR | Status: DC | PRN
  Administered 2011-12-01: 30 mL via INTRA_ARTICULAR

## 2011-12-01 MED ORDER — SODIUM CHLORIDE 0.9 % IR SOLN
Status: DC | PRN
  Administered 2011-12-01: 3000 mL

## 2011-12-01 MED ORDER — ROCURONIUM BROMIDE 100 MG/10ML IV SOLN
INTRAVENOUS | Status: DC | PRN
  Administered 2011-12-01: 50 mg via INTRAVENOUS

## 2011-12-01 MED ORDER — EXENATIDE 10 MCG/0.04ML ~~LOC~~ SOPN
10.0000 ug | PEN_INJECTOR | Freq: Two times a day (BID) | SUBCUTANEOUS | Status: DC
  Filled 2011-12-01: qty 2.4

## 2011-12-01 MED ORDER — FENTANYL CITRATE 0.05 MG/ML IJ SOLN
100.0000 ug | Freq: Once | INTRAMUSCULAR | Status: AC
  Administered 2011-12-01: 100 ug via INTRAVENOUS

## 2011-12-01 MED ORDER — METOCLOPRAMIDE HCL 10 MG PO TABS: 5.0000 mg | ORAL_TABLET | Freq: Three times a day (TID) | ORAL | Status: DC | PRN

## 2011-12-01 MED ORDER — CEFAZOLIN SODIUM 1-5 GM-% IV SOLN
1.0000 g | Freq: Three times a day (TID) | INTRAVENOUS | Status: AC
  Administered 2011-12-01 – 2011-12-02 (×3): 1 g via INTRAVENOUS
  Filled 2011-12-01 (×3): qty 50

## 2011-12-01 MED ORDER — GLYCOPYRROLATE 0.2 MG/ML IJ SOLN
INTRAMUSCULAR | Status: DC | PRN
  Administered 2011-12-01: 0.4 mg via INTRAVENOUS
  Administered 2011-12-01: 0.2 mg via INTRAVENOUS

## 2011-12-01 MED ORDER — ONDANSETRON HCL 4 MG/2ML IJ SOLN
INTRAMUSCULAR | Status: DC | PRN
  Administered 2011-12-01: 4 mg via INTRAVENOUS

## 2011-12-01 MED ORDER — METHOCARBAMOL 500 MG PO TABS
500.0000 mg | ORAL_TABLET | Freq: Four times a day (QID) | ORAL | Status: DC | PRN
  Administered 2011-12-02 – 2011-12-04 (×4): 500 mg via ORAL
  Filled 2011-12-01 (×6): qty 1

## 2011-12-01 MED ORDER — DIPHENHYDRAMINE HCL 12.5 MG/5ML PO ELIX: 12.5000 mg | ORAL_SOLUTION | Freq: Four times a day (QID) | ORAL | Status: DC | PRN

## 2011-12-01 MED ORDER — FERROUS SULFATE 325 (65 FE) MG PO TABS
325.0000 mg | ORAL_TABLET | Freq: Two times a day (BID) | ORAL | Status: DC
  Administered 2011-12-01 – 2011-12-04 (×6): 325 mg via ORAL
  Filled 2011-12-01 (×8): qty 1

## 2011-12-01 MED ORDER — EPHEDRINE SULFATE 50 MG/ML IJ SOLN
INTRAMUSCULAR | Status: DC | PRN
  Administered 2011-12-01: 10 mg via INTRAVENOUS
  Administered 2011-12-01: 5 mg via INTRAVENOUS

## 2011-12-01 MED ORDER — MIDAZOLAM HCL 5 MG/ML IJ SOLN
2.0000 mg | Freq: Once | INTRAMUSCULAR | Status: AC
  Administered 2011-12-01: 2 mg via INTRAVENOUS

## 2011-12-01 MED ORDER — WARFARIN SODIUM 7.5 MG PO TABS
7.5000 mg | ORAL_TABLET | Freq: Once | ORAL | Status: AC
  Administered 2011-12-01: 7.5 mg via ORAL
  Filled 2011-12-01: qty 1

## 2011-12-01 MED ORDER — ONDANSETRON HCL 4 MG/2ML IJ SOLN: 4.0000 mg | Freq: Four times a day (QID) | INTRAMUSCULAR | Status: DC | PRN

## 2011-12-01 MED ORDER — PHENOL 1.4 % MT LIQD: 1.0000 | OROMUCOSAL | Status: DC | PRN

## 2011-12-01 MED ORDER — ENOXAPARIN SODIUM 30 MG/0.3ML ~~LOC~~ SOLN
30.0000 mg | Freq: Two times a day (BID) | SUBCUTANEOUS | Status: DC
  Administered 2011-12-02 – 2011-12-04 (×5): 30 mg via SUBCUTANEOUS
  Filled 2011-12-01 (×7): qty 0.3

## 2011-12-01 MED ORDER — PROPOFOL 10 MG/ML IV BOLUS
INTRAVENOUS | Status: DC | PRN
  Administered 2011-12-01: 200 mg via INTRAVENOUS

## 2011-12-01 MED ORDER — ACETAMINOPHEN 10 MG/ML IV SOLN
INTRAVENOUS | Status: AC
  Filled 2011-12-01: qty 100

## 2011-12-01 MED ORDER — BUPIVACAINE HCL (PF) 0.25 % IJ SOLN
INTRAMUSCULAR | Status: AC
  Filled 2011-12-01: qty 30

## 2011-12-01 MED ORDER — ACETAMINOPHEN 10 MG/ML IV SOLN
INTRAVENOUS | Status: DC | PRN
  Administered 2011-12-01: 1000 mg via INTRAVENOUS

## 2011-12-01 MED ORDER — PHENYLEPHRINE HCL 10 MG/ML IJ SOLN
INTRAMUSCULAR | Status: DC | PRN
  Administered 2011-12-01: 80 ug via INTRAVENOUS

## 2011-12-01 MED ORDER — METOCLOPRAMIDE HCL 5 MG/ML IJ SOLN: 5.0000 mg | Freq: Three times a day (TID) | INTRAMUSCULAR | Status: DC | PRN

## 2011-12-01 MED ORDER — MORPHINE SULFATE (PF) 1 MG/ML IV SOLN
INTRAVENOUS | Status: AC
  Filled 2011-12-01: qty 25

## 2011-12-01 MED ORDER — DIPHENHYDRAMINE HCL 50 MG/ML IJ SOLN: 12.5000 mg | Freq: Four times a day (QID) | INTRAMUSCULAR | Status: DC | PRN

## 2011-12-01 MED ORDER — FENTANYL CITRATE 0.05 MG/ML IJ SOLN
INTRAMUSCULAR | Status: AC
  Filled 2011-12-01: qty 2

## 2011-12-01 MED ORDER — DOCUSATE SODIUM 100 MG PO CAPS
100.0000 mg | ORAL_CAPSULE | Freq: Two times a day (BID) | ORAL | Status: DC
  Administered 2011-12-01 – 2011-12-04 (×6): 100 mg via ORAL
  Filled 2011-12-01 (×9): qty 1

## 2011-12-01 MED ORDER — GLIPIZIDE ER 5 MG PO TB24
5.0000 mg | ORAL_TABLET | Freq: Every day | ORAL | Status: DC
  Administered 2011-12-01 – 2011-12-04 (×4): 5 mg via ORAL
  Filled 2011-12-01 (×5): qty 1

## 2011-12-01 MED ORDER — DEXTROSE 5 % IV SOLN
500.0000 mg | Freq: Four times a day (QID) | INTRAVENOUS | Status: DC | PRN
  Administered 2011-12-01: 500 mg via INTRAVENOUS
  Filled 2011-12-01: qty 5

## 2011-12-01 MED ORDER — MIDAZOLAM HCL 2 MG/2ML IJ SOLN
INTRAMUSCULAR | Status: AC
  Filled 2011-12-01: qty 2

## 2011-12-01 MED ORDER — ACETAMINOPHEN 650 MG RE SUPP: 650.0000 mg | Freq: Four times a day (QID) | RECTAL | Status: DC | PRN

## 2011-12-01 MED ORDER — ACETAMINOPHEN 325 MG PO TABS
650.0000 mg | ORAL_TABLET | Freq: Four times a day (QID) | ORAL | Status: DC | PRN
  Administered 2011-12-02: 650 mg via ORAL
  Filled 2011-12-01: qty 2

## 2011-12-01 MED ORDER — NEOSTIGMINE METHYLSULFATE 1 MG/ML IJ SOLN
INTRAMUSCULAR | Status: DC | PRN
  Administered 2011-12-01: 3 mg via INTRAVENOUS
  Administered 2011-12-01: 1 mg via INTRAVENOUS

## 2011-12-01 MED ORDER — GABAPENTIN 100 MG PO CAPS
100.0000 mg | ORAL_CAPSULE | Freq: Two times a day (BID) | ORAL | Status: DC
  Administered 2011-12-01 – 2011-12-04 (×6): 100 mg via ORAL
  Filled 2011-12-01 (×7): qty 1

## 2011-12-01 MED ORDER — MORPHINE SULFATE (PF) 1 MG/ML IV SOLN
INTRAVENOUS | Status: DC
  Administered 2011-12-01: 3 mg via INTRAVENOUS
  Administered 2011-12-02: 2 mg via INTRAVENOUS
  Administered 2011-12-02: 5 mg via INTRAVENOUS
  Administered 2011-12-02: 6 mg via INTRAVENOUS
  Filled 2011-12-01: qty 25

## 2011-12-01 MED ORDER — ONDANSETRON HCL 4 MG PO TABS: 4.0000 mg | ORAL_TABLET | Freq: Four times a day (QID) | ORAL | Status: DC | PRN

## 2011-12-01 MED ORDER — VENLAFAXINE HCL ER 75 MG PO CP24
75.0000 mg | ORAL_CAPSULE | Freq: Every day | ORAL | Status: DC
  Administered 2011-12-02 – 2011-12-04 (×3): 75 mg via ORAL
  Filled 2011-12-01 (×3): qty 1

## 2011-12-01 MED ORDER — PANTOPRAZOLE SODIUM 40 MG PO TBEC
40.0000 mg | DELAYED_RELEASE_TABLET | Freq: Every day | ORAL | Status: DC
  Administered 2011-12-02 – 2011-12-04 (×3): 40 mg via ORAL
  Filled 2011-12-01 (×3): qty 1

## 2011-12-01 MED ORDER — MORPHINE SULFATE 4 MG/ML IJ SOLN
INTRAMUSCULAR | Status: AC
  Filled 2011-12-01: qty 1

## 2011-12-01 MED ORDER — LACTATED RINGERS IV SOLN
INTRAVENOUS | Status: DC
  Administered 2011-12-01 (×2): via INTRAVENOUS

## 2011-12-01 MED ORDER — MENTHOL 3 MG MT LOZG: 1.0000 | LOZENGE | OROMUCOSAL | Status: DC | PRN

## 2011-12-01 MED ORDER — 0.9 % SODIUM CHLORIDE (POUR BTL) OPTIME
TOPICAL | Status: DC | PRN
  Administered 2011-12-01: 1000 mL

## 2011-12-01 MED ORDER — MORPHINE SULFATE (PF) 1 MG/ML IV SOLN
INTRAVENOUS | Status: DC
  Administered 2011-12-01: 14:00:00 via INTRAVENOUS

## 2011-12-01 MED ORDER — POTASSIUM CHLORIDE IN NACL 20-0.9 MEQ/L-% IV SOLN
INTRAVENOUS | Status: DC
  Administered 2011-12-01 – 2011-12-02 (×2): via INTRAVENOUS
  Filled 2011-12-01 (×4): qty 1000

## 2011-12-01 MED ORDER — WARFARIN - PHARMACIST DOSING INPATIENT: Freq: Every day | Status: DC

## 2011-12-01 MED ORDER — MORPHINE SULFATE 4 MG/ML IJ SOLN
INTRAMUSCULAR | Status: DC | PRN
  Administered 2011-12-01: 4 mg via INTRAVENOUS

## 2011-12-01 MED ORDER — LIDOCAINE HCL (CARDIAC) 20 MG/ML IV SOLN
INTRAVENOUS | Status: DC | PRN
  Administered 2011-12-01: 100 mg via INTRAVENOUS

## 2011-12-01 MED ORDER — BISACODYL 10 MG RE SUPP: 10.0000 mg | Freq: Every day | RECTAL | Status: DC | PRN

## 2011-12-01 SURGICAL SUPPLY — 55 items
BANDAGE ESMARK 6X9 LF (GAUZE/BANDAGES/DRESSINGS) ×1 IMPLANT
BLADE SAG 18X100X1.27 (BLADE) ×4 IMPLANT
BNDG ESMARK 6X9 LF (GAUZE/BANDAGES/DRESSINGS) ×2
BOOTCOVER CLEANROOM LRG (PROTECTIVE WEAR) ×4 IMPLANT
BOWL SMART MIX CTS (DISPOSABLE) ×2 IMPLANT
CEMENT BONE SIMPLEX SPEEDSET (Cement) ×4 IMPLANT
CLOTH BEACON ORANGE TIMEOUT ST (SAFETY) ×2 IMPLANT
COVER BACK TABLE 24X17X13 BIG (DRAPES) ×2 IMPLANT
COVER SURGICAL LIGHT HANDLE (MISCELLANEOUS) ×2 IMPLANT
CUFF TOURNIQUET SINGLE 34IN LL (TOURNIQUET CUFF) ×2 IMPLANT
DRAPE EXTREMITY T 121X128X90 (DRAPE) ×2 IMPLANT
DRAPE PROXIMA HALF (DRAPES) ×2 IMPLANT
DRAPE U-SHAPE 47X51 STRL (DRAPES) ×2 IMPLANT
DRSG PAD ABDOMINAL 8X10 ST (GAUZE/BANDAGES/DRESSINGS) ×2 IMPLANT
DURAPREP 26ML APPLICATOR (WOUND CARE) ×2 IMPLANT
ELECT CAUTERY BLADE 6.4 (BLADE) ×2 IMPLANT
ELECT REM PT RETURN 9FT ADLT (ELECTROSURGICAL) ×2
ELECTRODE REM PT RTRN 9FT ADLT (ELECTROSURGICAL) ×1 IMPLANT
EVACUATOR 1/8 PVC DRAIN (DRAIN) ×2 IMPLANT
FACESHIELD LNG OPTICON STERILE (SAFETY) ×6 IMPLANT
GAUZE XEROFORM 5X9 LF (GAUZE/BANDAGES/DRESSINGS) ×2 IMPLANT
GLOVE BIOGEL PI IND STRL 8 (GLOVE) ×1 IMPLANT
GLOVE BIOGEL PI INDICATOR 8 (GLOVE) ×1
GLOVE ORTHO TXT STRL SZ7.5 (GLOVE) ×4 IMPLANT
GOWN PREVENTION PLUS XLARGE (GOWN DISPOSABLE) ×6 IMPLANT
GOWN STRL NON-REIN LRG LVL3 (GOWN DISPOSABLE) IMPLANT
GOWN STRL REIN 2XL XLG LVL4 (GOWN DISPOSABLE) ×2 IMPLANT
HANDPIECE INTERPULSE COAX TIP (DISPOSABLE) ×1
IMMOBILIZER KNEE 22 UNIV (SOFTGOODS) ×2 IMPLANT
IMMOBILIZER KNEE 24 THIGH 36 (MISCELLANEOUS) IMPLANT
IMMOBILIZER KNEE 24 UNIV (MISCELLANEOUS)
KIT BASIN OR (CUSTOM PROCEDURE TRAY) ×2 IMPLANT
KIT ROOM TURNOVER OR (KITS) ×2 IMPLANT
MANIFOLD NEPTUNE II (INSTRUMENTS) ×2 IMPLANT
NS IRRIG 1000ML POUR BTL (IV SOLUTION) ×2 IMPLANT
PACK TOTAL JOINT (CUSTOM PROCEDURE TRAY) ×2 IMPLANT
PAD ARMBOARD 7.5X6 YLW CONV (MISCELLANEOUS) ×4 IMPLANT
PAD CAST 4YDX4 CTTN HI CHSV (CAST SUPPLIES) ×1 IMPLANT
PADDING CAST COTTON 4X4 STRL (CAST SUPPLIES) ×1
PADDING CAST COTTON 6X4 STRL (CAST SUPPLIES) ×2 IMPLANT
RUBBERBAND STERILE (MISCELLANEOUS) ×2 IMPLANT
SET HNDPC FAN SPRY TIP SCT (DISPOSABLE) ×1 IMPLANT
SPONGE GAUZE 4X4 12PLY (GAUZE/BANDAGES/DRESSINGS) ×2 IMPLANT
STAPLER VISISTAT 35W (STAPLE) ×2 IMPLANT
SUCTION FRAZIER TIP 10 FR DISP (SUCTIONS) ×2 IMPLANT
SUT VIC AB 1 CTX 36 (SUTURE) ×2
SUT VIC AB 1 CTX36XBRD ANBCTR (SUTURE) ×2 IMPLANT
SUT VIC AB 2-0 CT1 27 (SUTURE) ×2
SUT VIC AB 2-0 CT1 TAPERPNT 27 (SUTURE) ×2 IMPLANT
SYR 30ML LL (SYRINGE) ×2 IMPLANT
SYR 30ML SLIP (SYRINGE) ×2 IMPLANT
TOWEL OR 17X24 6PK STRL BLUE (TOWEL DISPOSABLE) ×2 IMPLANT
TOWEL OR 17X26 10 PK STRL BLUE (TOWEL DISPOSABLE) ×2 IMPLANT
TRAY FOLEY CATH 14FR (SET/KITS/TRAYS/PACK) ×2 IMPLANT
WATER STERILE IRR 1000ML POUR (IV SOLUTION) ×6 IMPLANT

## 2011-12-01 NOTE — Transfer of Care (Signed)
Immediate Anesthesia Transfer of Care Note  Patient: [name] [name]  Procedure(s) Performed: Procedure(s) (LRB) with comments: TOTAL KNEE ARTHROPLASTY (Left) - left total knee arthroplasty  Patient Location: PACU  Anesthesia Type: General  Level of Consciousness: awake, alert  and oriented  Airway & Oxygen Therapy: Patient Spontanous Breathing and Patient connected to nasal cannula oxygen  Post-op Assessment: Report given to PACU RN and Patient moving all extremities X 4  Post vital signs: Reviewed and stable  Complications: No apparent anesthesia complications

## 2011-12-01 NOTE — Progress Notes (Signed)
Clinical Social Work Department BRIEF PSYCHOSOCIAL ASSESSMENT 12/01/2011  Patient:  [name] [name], [name] [name]     Account Number:  [id number]     Admit date:  12/01/2011  Clinical Social Worker:  Dennison Bulla  Date/Time:  12/01/2011 04:15 PM  Referred by:  Physician  Date Referred:  12/01/2011 Referred for  SNF Placement   Other Referral:   Interview type:  Patient Other interview type:    PSYCHOSOCIAL DATA Living Status:  ALONE Admitted from facility:   Level of care:   Primary support name:  Germaine Pomfret Primary support relationship to patient:  SIBLING Degree of support available:   Strong    CURRENT CONCERNS Current Concerns  Post-Acute Placement   Other Concerns:    SOCIAL WORK ASSESSMENT / PLAN CSW received referral from MD reporting that patient needed SNF at dc. Per chart review, patient will be ready to dc Friday or Saturday. CSW reviewed chart and met with patient at bedside. No visitors present.    CSW introduced myself and explained role. Patient reported that she had her right knee operated on in June 2013 and went to [facility] for rehab. Patient has already spoken to [facility] and has signed up for rehab at [facility] again. CSW confirmed with SNF that they can accept patient. SNF can accept patient on Saturday. CSW explained process and patient is familiar with process.    CSW completed FL2 and sent information to [facility]. CSW placed FL2 in chart for MD signature.   Assessment/plan status:  Psychosocial Support/Ongoing Assessment of Needs Other assessment/ plan:   Information/referral to community resources:   SNF information    PATIENT'S/FAMILY'S RESPONSE TO PLAN OF CARE: Patient alert and oriented. Patient receptive to CSW consult. Patient engaged throughout assessment.        Coverage for [name] [name]

## 2011-12-01 NOTE — Anesthesia Preprocedure Evaluation (Addendum)
Anesthesia Evaluation  Patient identified by MRN, date of birth, ID band Patient awake    Reviewed: Allergy & Precautions, H&P , NPO status , Patient's Chart, lab work & pertinent test results  History of Anesthesia Complications (+) PONV  Airway Mallampati: II TM Distance: <3 FB Neck ROM: Full    Dental  (+) Teeth Intact and Dental Advisory Given   Pulmonary  breath sounds clear to auscultation        Cardiovascular hypertension, Rhythm:Regular Rate:Normal     Neuro/Psych Depression    GI/Hepatic GERD-  ,(+) Hepatitis -  Endo/Other  diabetesMorbid obesity  Renal/GU Renal disease     Musculoskeletal   Abdominal (+) + obese,  Abdomen: soft.    Peds  Hematology   Anesthesia Other Findings   Reproductive/Obstetrics                         Anesthesia Physical Anesthesia Plan  ASA: III  Anesthesia Plan: General   Post-op Pain Management:    Induction: Intravenous  Airway Management Planned: Oral ETT  Additional Equipment:   Intra-op Plan:   Post-operative Plan: Extubation in OR  Informed Consent: I have reviewed the patients History and Physical, chart, labs and discussed the procedure including the risks, benefits and alternatives for the proposed anesthesia with the patient or authorized representative who has indicated his/her understanding and acceptance.     Plan Discussed with: CRNA and Surgeon  Anesthesia Plan Comments:         Anesthesia Quick Evaluation

## 2011-12-01 NOTE — Interval H&P Note (Signed)
History and Physical Interval Note:  12/01/2011 8:18 AM  [name] [name]  has presented today for surgery, with the diagnosis of DJD LEFT KNEE  The various methods of treatment have been discussed with the patient and family. After consideration of risks, benefits and other options for treatment, the patient has consented to  Procedure(s) (LRB) with comments: TOTAL KNEE ARTHROPLASTY (Left) as a surgical intervention .  The patient's history has been reviewed, patient examined, no change in status, stable for surgery.  I have reviewed the patient's chart and labs.  Questions were answered to the patient's satisfaction.     MURPHY,DANIEL F

## 2011-12-01 NOTE — Progress Notes (Signed)
Orthopedic Tech Progress Note Patient Details:  [name] [name] 17-Jul-1955 [id number]  Patient ID: [name] [name], female   DOB: June 30, 1955, 56 y.o.   MRN: [id number] Pt in cpm @1420   [name] 12/01/2011, 2:25 PM

## 2011-12-01 NOTE — Preoperative (Signed)
Beta Blockers   Reason not to administer Beta Blockers:Not Applicable 

## 2011-12-01 NOTE — Progress Notes (Signed)
UR COMPLETED  

## 2011-12-01 NOTE — Progress Notes (Signed)
Orthopedic Tech Progress Note Patient Details:  [name] [name] 29-Mar-1955 [id number]  Patient ID: [name], female   DOB: 06-16-1955, 56 y.o.   MRN: [id number]   [name] 12/01/2011, 5:09 PM Trapeze bar

## 2011-12-01 NOTE — Progress Notes (Signed)
Orthopedic Tech Progress Note Patient Details:  [name] [name] [birth year] [id number]  CPM Left Knee CPM Left Knee: On Left Knee Flexion (Degrees): 60  Left Knee Extension (Degrees): 0  Additional Comments: no ohf's at this time; when available , ortho will provide one   [name] 12/01/2011, 2:25 PM

## 2011-12-01 NOTE — Anesthesia Procedure Notes (Addendum)
Anesthesia Regional Block:  Femoral nerve block  Pre-Anesthetic Checklist: ,, timeout performed, Correct Patient, Correct Site, Correct Laterality, Correct Procedure, Correct Position, site marked, Risks and benefits discussed,  Surgical consent,  Pre-op evaluation,  At surgeon's request and post-op pain management  Laterality: Left  Prep: chloraprep       Needles:  Injection technique: Single-shot  Needle Type: Stimulator Needle - 80     Needle Length: 8cm  Needle Gauge: 22 and 22 G    Additional Needles:  Procedures: nerve stimulator Femoral nerve block  Nerve Stimulator or Paresthesia:  Response: 0.48 mA,   Additional Responses:   Narrative:  Start time: 12/01/2011 10:08 AM End time: 12/01/2011 10:20 AM Injection made incrementally with aspirations every 5 mL. Anesthesiologist: Dr Gypsy Balsam  Additional Notes: 1008-1020 L FNB POP CHG prep, sterile tech #22 stim needle w/stim down to .48ma Multiple neg asp Marc .5% w/epi 25cc+decadron 4mg  infiltrated No compl Dr Gypsy Balsam   Procedure Name: Intubation Date/Time: 12/01/2011 11:26 AM Performed by: Jefm Miles E Pre-anesthesia Checklist: Patient identified and Timeout performed Patient Re-evaluated:Patient Re-evaluated prior to inductionOxygen Delivery Method: Circle system utilized Preoxygenation: Pre-oxygenation with 100% oxygen Intubation Type: IV induction Ventilation: Oral airway inserted - appropriate to patient size and Mask ventilation without difficulty Laryngoscope Size: Mac and 3 Grade View: Grade I Tube type: Oral Tube size: 7.0 mm Number of attempts: 1 Airway Equipment and Method: Stylet Placement Confirmation: breath sounds checked- equal and bilateral,  ETT inserted through vocal cords under direct vision and positive ETCO2 Secured at: 21 cm Tube secured with: Tape Dental Injury: Teeth and Oropharynx as per pre-operative assessment

## 2011-12-01 NOTE — Brief Op Note (Signed)
12/01/2011  1:06 PM  PATIENT:  [name] [name]  56 y.o. female  PRE-OPERATIVE DIAGNOSIS:  DJD LEFT KNEE  POST-OPERATIVE DIAGNOSIS:  DJD LEFT KNEE  PROCEDURE:  Procedure(s) (LRB) with comments: TOTAL KNEE ARTHROPLASTY (Left) - left total knee arthroplasty  SURGEON:  Surgeon(s) and Role:    * Loreta Ave, MD - Primary  PHYSICIAN ASSISTANT: Zonia Kief M     ANESTHESIA:   regional and general  EBL:  Total I/O In: 1000 [I.V.:1000] Out: 150 [Urine:100; Blood:50]  BLOOD ADMINISTERED:none  SPECIMEN:  No Specimen  DISPOSITION OF SPECIMEN:  N/A  COUNTS:  YES  TOURNIQUET:  * Missing tourniquet times found for documented tourniquets in log:  [id number] *   PATIENT DISPOSITION:  PACU - hemodynamically stable.

## 2011-12-01 NOTE — Progress Notes (Signed)
ANTICOAGULATION CONSULT NOTE - Initial Consult  Pharmacy Consult for coumadin Indication: VTE prophylaxis  Allergies  Allergen Reactions  . Statins Other (See Comments)    Leg pain  . Tetracycline     REACTION: hepatitis    Patient Measurements:   Heparin Dosing Weight:   Vital Signs: Temp: 98.3 F (36.8 C) (09/25 1650) Temp src: Oral (09/25 1650) BP: 133/87 mmHg (09/25 1650) Pulse Rate: 97  (09/25 1650)  Labs: No results found for this basename: HGB:2,HCT:3,PLT:3,APTT:3,LABPROT:3,INR:3,HEPARINUNFRC:3,CREATININE:3,CKTOTAL:3,CKMB:3,TROPONINI:3 in the last 72 hours  The CrCl is unknown because both a height and weight (above a minimum accepted value) are required for this calculation.   Medical History: Past Medical History  Diagnosis Date  . Gout   . Hypertension   . Diabetes mellitus   . Asthma   . Complication of anesthesia   . PONV (postoperative nausea and vomiting)   . Depression   . Bronchitis     hx of  . Renal disorder     "medication induced"  . GERD (gastroesophageal reflux disease)   . Benign skin lesion     removed by dermatologist  . Arthritis   . Hepatitis     hep A  . Chronic low back pain     hx of  . Chronic neck pain     hx of  . Cataract     left    Medications:  Scheduled:    .  ceFAZolin (ANCEF) IV  1 g Intravenous Q8H  .  ceFAZolin (ANCEF) IV  2 g Intravenous 60 min Pre-Op  . coumadin book   Does not apply Once  . docusate sodium  100 mg Oral BID  . enoxaparin (LOVENOX) injection  30 mg Subcutaneous Q12H  . exenatide  10 mcg Subcutaneous BID WC  . fentaNYL  100 mcg Intravenous Once  . ferrous sulfate  325 mg Oral BID AC  . gabapentin  100 mg Oral BID  . glipiZIDE  5 mg Oral Daily  . insulin aspart  0-15 Units Subcutaneous TID WC  . lisinopril-hydrochlorothiazide  1 tablet Oral Daily  . loratadine  10 mg Oral Daily  . midazolam  2 mg Intravenous Once  . morphine   Intravenous Q4H  . morphine      . pantoprazole  40 mg  Oral Daily  . venlafaxine XR  75 mg Oral Daily  . warfarin  7.5 mg Oral ONCE-1800  . warfarin   Does not apply Once  . Warfarin - Pharmacist Dosing Inpatient   Does not apply q1800    Assessment: 56 yr old female admitted for lt total knee replacement. Rt total knee replacement was done in June and pt has done well. INR 0.99 Goal of Therapy:  INR 2-3 Monitor platelets by anticoagulation protocol: Yes   Plan:  1) Coumadin 7.5 mg tonight   (6 points) 2) PT/INR daily 3) Coumadin booklet for pt 4) Have pt watch coumadin video.  Eugene Garnet 12/01/2011,5:27 PM

## 2011-12-01 NOTE — Progress Notes (Addendum)
Clinical Social Work Department CLINICAL SOCIAL WORK PLACEMENT NOTE 12/01/2011  Patient:  [name] [name], [name] [name]  Account Number:  [id number] Admit date:  12/01/2011  Clinical Social Worker:  Unk Lightning, LCSW  Date/time:  12/01/2011 04:15 PM  Clinical Social Work is seeking post-discharge placement for this patient at the following level of care:   SKILLED NURSING   (*CSW will update this form in Epic as items are completed)   Refused  Patient/family provided with [facility] Department of Clinical Social Work's list of facilities offering this level of care within the geographic area requested by the patient (or if unable, by the patient's family).   Refused Patient/family informed of their freedom to choose among providers that offer the needed level of care, that participate in Medicare, Medicaid or managed care program needed by the patient, have an available bed and are willing to accept the patient.   Refused Patient/family informed of [facility]' ownership interest in [facility], as well as of the fact that they are under no obligation to receive care at this facility.  PASARR submitted to EDS on existing # PASARR number received from EDS on   FL2 transmitted to all facilities in geographic area requested by pt/family on  12/01/2011 FL2 transmitted to all facilities within larger geographic area on   Patient informed that his/her managed care company has contracts with or will negotiate with  certain facilities, including the following:     Patient/family informed of bed offers received:  12/01/2011 Patient chooses bed at [facility] Physician recommends and patient chooses bed at    Patient to be transferred to [facility] on  12/04/2011 Patient to be transferred to facility by Ambulance  Sharin Mons)  The following physician request were entered in Epic:   Additional Comments: 12/02/11  Bed will be available at [facility] on Saturday 12/04/11. Note left for MD of this.  Patient is pleased.  Lorri Frederick. [place] Pugh  [phone]

## 2011-12-01 NOTE — Anesthesia Postprocedure Evaluation (Signed)
  Anesthesia Post-op Note  Patient: [name] [name]  Procedure(s) Performed: Procedure(s) (LRB) with comments: TOTAL KNEE ARTHROPLASTY (Left) - left total knee arthroplasty  Patient Location: PACU  Anesthesia Type: General and GA combined with regional for post-op pain  Level of Consciousness: awake and alert   Airway and Oxygen Therapy: Patient Spontanous Breathing  Post-op Pain: mild  Post-op Assessment: Post-op Vital signs reviewed, Patient's Cardiovascular Status Stable, Respiratory Function Stable, Patent Airway, No signs of Nausea or vomiting and Pain level controlled  Post-op Vital Signs: stable  Complications: No apparent anesthesia complications

## 2011-12-02 ENCOUNTER — Encounter (HOSPITAL_COMMUNITY): Payer: Self-pay | Admitting: Orthopedic Surgery

## 2011-12-02 LAB — BASIC METABOLIC PANEL
BUN: 33 mg/dL — ABNORMAL HIGH (ref 6–23)
CO2: 25 mEq/L (ref 19–32)
Calcium: 9 mg/dL (ref 8.4–10.5)
Chloride: 100 mEq/L (ref 96–112)
Creatinine, Ser: 1.22 mg/dL — ABNORMAL HIGH (ref 0.50–1.10)
GFR calc Af Amer: 62 mL/min — ABNORMAL LOW (ref >90)
GFR calc Af Amer: 56 mL/min — ABNORMAL LOW (ref >90)
GFR calc non Af Amer: 48 mL/min — ABNORMAL LOW (ref >90)
Potassium: 5.1 mEq/L (ref 3.5–5.1)
Sodium: 138 mEq/L (ref 135–145)

## 2011-12-02 LAB — CBC
HCT: 34 % — ABNORMAL LOW (ref 36.0–46.0)
Hemoglobin: 10.7 g/dL — ABNORMAL LOW (ref 12.0–15.0)
RBC: 3.81 MIL/uL — ABNORMAL LOW (ref 3.87–5.11)
RDW: 14.8 % (ref 11.5–15.5)
WBC: 13.9 10*3/uL — ABNORMAL HIGH (ref 4.0–10.5)

## 2011-12-02 LAB — GLUCOSE, CAPILLARY
Glucose-Capillary: 195 mg/dL — ABNORMAL HIGH (ref 70–99)
Glucose-Capillary: 127 mg/dL — ABNORMAL HIGH (ref 70–99)

## 2011-12-02 LAB — PROTIME-INR: INR: 0.97 (ref 0.00–1.49)

## 2011-12-02 MED ORDER — SODIUM CHLORIDE 0.9 % IV SOLN
INTRAVENOUS | Status: DC
  Administered 2011-12-02 – 2011-12-04 (×5): via INTRAVENOUS

## 2011-12-02 MED ORDER — OXYCODONE-ACETAMINOPHEN 5-325 MG PO TABS
1.0000 | ORAL_TABLET | ORAL | Status: DC | PRN
  Administered 2011-12-02: 1 via ORAL
  Administered 2011-12-02 – 2011-12-04 (×7): 2 via ORAL
  Filled 2011-12-02 (×5): qty 2
  Filled 2011-12-02: qty 1
  Filled 2011-12-02 (×3): qty 2

## 2011-12-02 MED ORDER — WARFARIN SODIUM 5 MG PO TABS
5.0000 mg | ORAL_TABLET | Freq: Once | ORAL | Status: AC
  Administered 2011-12-02: 5 mg via ORAL
  Filled 2011-12-02: qty 1

## 2011-12-02 NOTE — Progress Notes (Signed)
ANTICOAGULATION CONSULT - COUMADIN  Pharmacy Consult for Coumadin  HPI: 56 y.o.female admitted for DJD LEFT KNEE now s/p L-TKA who is currently on Coumadin for VTE prophylaxis .  Allergies: Allergies  Allergen Reactions  . Statins Other (See Comments)    Leg pain  . Tetracycline     REACTION: hepatitis    Height/Weight: Height: 4' 9.87" (147 cm) Weight: 234 lb 2.1 oz (106.2 kg) IBW/kg (Calculated) : 40.61   Vitals: Blood pressure 121/55, pulse 88, temperature 98.4 F (36.9 C), temperature source Oral, resp. rate 16, height 4' 9.87" (1.47 m), weight 234 lb 2.1 oz (106.2 kg), SpO2 96.00%.  Medical / Surgical History: Past Medical History  Diagnosis Date  . Gout   . Hypertension   . Diabetes mellitus   . Asthma   . Complication of anesthesia   . PONV (postoperative nausea and vomiting)   . Depression   . Bronchitis     hx of  . Renal disorder     "medication induced"  . GERD (gastroesophageal reflux disease)   . Benign skin lesion     removed by dermatologist  . Arthritis   . Hepatitis     hep A  . Chronic low back pain     hx of  . Chronic neck pain     hx of  . Cataract     left   Past Surgical History  Procedure Date  . Cesarean section   . Total knee arthroplasty 08/18/2011    Procedure: TOTAL KNEE ARTHROPLASTY;  Surgeon: Loreta Ave, MD;  Location: [facility] OR;  Service: Orthopedics;  Laterality: Right;  right total knee arthroplasty  . Carpal tunnel release     bilaterally  . Back surgery     "stenosis"  . Knee arthroscopy     right    Current Labs:   [facility] 12/02/11 0540  HGB 10.7*  HCT 34.0*  PLT 425*  LABPROT 12.8  INR 0.97  CREATININE 1.12*   Lab Results  Component Value Date   INR 0.97 12/02/2011   INR 0.99 11/24/2011   INR 1.51* 08/21/2011   Estimated Creatinine Clearance: 58.4 ml/min (by C-G formula based on Cr of 1.12).  Pertinent Medication History: Medication Sig  . albuterol (PROVENTIL HFA;VENTOLIN HFA) 108 (90 BASE)  MCG/ACT inhaler Inhale 2 puffs into the lungs every 6 (six) hours as needed. For shortness of breath  . aspirin EC 81 MG tablet Take 81 mg by mouth daily.  . cetirizine (ZYRTEC) 10 MG tablet Take 10 mg by mouth daily.    . Coenzyme Q10 (CO Q 10 PO) Take 1 capsule by mouth 2 (two) times daily.  . diclofenac (VOLTAREN) 75 MG EC tablet Take 75 mg by mouth daily.  Marland Kitchen exenatide (BYETTA) 10 MCG/0.04ML SOLN Inject 10 mcg into the skin 2 (two) times daily with a meal.  . Febuxostat (ULORIC) 80 MG TABS Take 80 mg by mouth daily.   . ferrous sulfate 325 (65 FE) MG tablet Take 325 mg by mouth 2 (two) times daily before lunch and supper.  . Flaxseed, Linseed, 1200 MG CAPS Take 1,200 mg by mouth 2 (two) times daily.  Marland Kitchen gabapentin (NEURONTIN) 100 MG capsule Take 100 mg by mouth 2 (two) times daily.    Marland Kitchen glipiZIDE (GLUCOTROL XL) 5 MG 24 hr tablet Take 5 mg by mouth daily.    Marland Kitchen GLUCOSAMINE PO Take 1,800 mg by mouth 2 (two) times daily.  Marland Kitchen HYDROcodone-acetaminophen (VICODIN ES) 7.5-750 MG per  tablet Take 1 tablet by mouth every 6 (six) hours as needed. For pain  . lisinopril-hydrochlorothiazide (PRINZIDE,ZESTORETIC) 20-25 MG per tablet Take 1 tablet by mouth daily.  Marland Kitchen MAGNESIUM-ZINC PO Take 1 tablet by mouth 2 (two) times daily.  . Omega-3 Fatty Acids (FISH OIL) 1200 MG CAPS Take 1 capsule by mouth 2 (two) times daily.   . pantoprazole (PROTONIX) 40 MG tablet Take 40 mg by mouth daily.    . Red Yeast Rice Extract (RED YEAST RICE PO) Take 1 tablet by mouth daily.  Marland Kitchen venlafaxine (EFFEXOR-XR) 75 MG 24 hr capsule Take 75 mg by mouth daily.    . Vitamin D, Ergocalciferol, (DRISDOL) 50000 UNITS CAPS Take 50,000 Units by mouth every 7 (seven) days. On sunday   Scheduled:    .  ceFAZolin (ANCEF) IV  1 g Intravenous Q8H  .  ceFAZolin (ANCEF) IV  2 g Intravenous 60 min Pre-Op  . coumadin book   Does not apply Once  . docusate sodium  100 mg Oral BID  . enoxaparin (LOVENOX) injection  30 mg Subcutaneous Q12H  .  exenatide  10 mcg Subcutaneous BID WC  . fentaNYL  100 mcg Intravenous Once  . ferrous sulfate  325 mg Oral BID AC  . gabapentin  100 mg Oral BID  . glipiZIDE  5 mg Oral Daily  . hydrochlorothiazide  25 mg Oral Daily  . insulin aspart  0-15 Units Subcutaneous TID WC  . loratadine  10 mg Oral Daily  . midazolam  2 mg Intravenous Once  . morphine      . pantoprazole  40 mg Oral Daily  . venlafaxine XR  75 mg Oral Daily  . warfarin  7.5 mg Oral ONCE-1800    Assessment:  56 y/o female s/p L-TKA on Coumdadin for VTE prophylaxis   INR 0.97 following first dose of Coumadin.  No complications observed.  Goals:  Target INR of 2-3.  Plan:  Will repeat Coumadin 7.5 mg today.    Daily INR's, CBC.  Kambri Dismore, Elisha Headland, Pharm. D. 12/02/2011, 9:54 AM

## 2011-12-02 NOTE — Progress Notes (Signed)
Agree with PT evaluation.  Siriyah Ambrosius, PT DPT [phone]  

## 2011-12-02 NOTE — Progress Notes (Signed)
Agree with PT treatment note.  Rilynn Habel, PT DPT [phone]  

## 2011-12-02 NOTE — Progress Notes (Signed)
Physical Therapy Progress Note   12/02/11 1500  PT Visit Information  Last PT Received On 12/02/11  Assistance Needed +1  PT Time Calculation  PT Start Time 0239  PT Stop Time 0308  PT Time Calculation (min) 29 min  Subjective Data  Subjective Feeling better need to use the restroom  Patient Stated Goal To be d/c to snf  Precautions  Precautions Knee  Required Braces or Orthoses Knee Immobilizer - Left  Knee Immobilizer - Left On except when in CPM  Restrictions  Weight Bearing Restrictions Yes  LLE Weight Bearing WBAT  Cognition  Overall Cognitive Status Appears within functional limits for tasks assessed/performed  Arousal/Alertness Awake/alert  Orientation Level Appears intact for tasks assessed  Behavior During Session [facility] for tasks performed  Bed Mobility  Bed Mobility Sit to Supine  Sit to Supine 4: Min guard;HOB flat  Details for Bed Mobility Assistance Cues for safety, proper technique, and hand placement  Transfers  Transfers Sit to Stand;Stand to Sit  Sit to Stand 4: Min assist  Stand to Sit 4: Min guard  Details for Transfer Assistance (A) with initiating transfer.  Cues for proper technique and safety  Ambulation/Gait  Ambulation/Gait Assistance 4: Min assist  Ambulation Distance (Feet) 20 Feet  Assistive device Rolling walker  Ambulation/Gait Assistance Details Cues for proper technique, safety, posture, and balance.  Gait Pattern Step-to pattern;Decreased stride length;Antalgic  Gait velocity decreased  Stairs No  Wheelchair Mobility  Wheelchair Mobility No  PT - End of Session  Equipment Utilized During Treatment Gait belt;Left knee immobilizer  Activity Tolerance Patient tolerated treatment well  Patient left in bed;with call bell/phone within reach;in CPM  Nurse Communication Mobility status  PT - Assessment/Plan  Comments on Treatment Session Pt was motivated to participate in therapy.  Plan for next session is to increase ambulation distance and  exercises.   PT Plan Discharge plan remains appropriate;Frequency remains appropriate  PT Frequency 7X/week  Recommendations for Other Services Other (comment) (None)  Follow Up Recommendations Skilled nursing facility  Equipment Recommended None recommended by PT  Acute Rehab PT Goals  PT Goal Formulation With patient  Time For Goal Achievement 12/09/11  Potential to Achieve Goals Good  Pt will go Sit to Supine/Side with modified independence;with HOB 0 degrees  PT Goal: Sit to Supine/Side - Progress Progressing toward goal  Pt will go Sit to Stand with modified independence;with upper extremity assist  PT Goal: Sit to Stand - Progress Progressing toward goal  Pt will go Stand to Sit with modified independence;with upper extremity assist  PT Goal: Stand to Sit - Progress Progressing toward goal  Pt will Ambulate 51 - 150 feet;with supervision;with least restrictive assistive device  PT Goal: Ambulate - Progress Progressing toward goal      6/10 pain in L LE  Almeda Ezra, SPT

## 2011-12-02 NOTE — Op Note (Signed)
[name] [name], DIBLER NO.:  [id number]  MEDICAL RECORD NO.:  [id number]  LOCATION:  5N15C                        FACILITY:  [facility]  PHYSICIAN:  Loreta Ave, M.D. DATE OF BIRTH:  [birth year]  DATE OF PROCEDURE:  12/01/2011 DATE OF DISCHARGE:                              OPERATIVE REPORT   PREOPERATIVE DIAGNOSES:  End-stage degenerative arthritis, left knee. Varus alignment, flexion contracture.  Marked exogenous morbid obesity, centrally.  POSTOPERATIVE DIAGNOSES:  End-stage degenerative arthritis, left knee. Varus alignment, flexion contracture. Marked exogenous morbid obesity, centrally.  PROCEDURE:  Modified minimally invasive left total knee replacement with Stryker triathlon prosthesis.  Cemented pegged posterior stabilized #3 femoral component.  Cemented #4 tibial component, 9-mm polyethylene insert.  Cemented resurfacing 32-mm patellar component.  Medial capsule release.  SURGEON:  Loreta Ave, M.D.  ASSISTANT:  Genene Churn. Barry Dienes, [place], present throughout the entire case and necessary for timely completion of the procedure.  ANESTHESIA:  General.  BLOOD LOSS:  Minimal.  SPECIMENS:  None.  CULTURES:  None.  COMPLICATIONS:  None.  DRESSINGS:  Soft compressive with a knee immobilizer.  DRAINS:  Hemovac x1.  TOURNIQUET TIME:  45 minutes.  PROCEDURE:  The patient was brought to the operating room, placed on the operating table in supine position.  After adequate anesthesia had been obtained, knee examined.  I fairly fixed 7 degrees flexion contracture. A 5-7 degrees fixed varus.  Flexion to 90 degrees.  Tourniquet applied. Prepped and draped in usual sterile fashion.  Exsanguinated with elevation of Esmarch, tourniquet inflated to 350 mmHg.  Straight incision above the patella down to the tibial tubercle.  Medial arthrotomy, vastus splitting, preserving quad tendon.  Hemostasis with cautery.  Knee exposed.  Remnants of menisci, cruciate  ligaments, loose body spurs removed.  Distal femur exposed.  Medial capsular release done.  Intramedullary guide.  A 10-mm resection, 5 degrees of valgus. Using epicondylar axis, the femur was sized, cut, and fitted for pegged #3 posterior stabilized component.  Proximal tibial resection. Extramedullary guide.  A 3-degree posterior slope cut taken below the defect medially.  Debris cleared throughout the knee and flexion and extension.  Patella exposed, posterior 10-mm removed.  Drilled, sized, and fitted for 32-mm component.  Knee was nicely balanced in flexion and extension.  Trials were put in place, #3 on the femur and #4 on the tibia.  A 9-mm insert, 32 patella.  With this construct, excellent biomechanical axis.  Nicely balanced in flexion and extension with good stability.  Full extension achieved.  Good patellar tracking.  Tibia was marked for rotation and hand reamed.  All trials were removed.  Copious irrigation with a pulse irrigating device.  Cement prepared, placed on all components, firmly seated.  Polyethylene attached to tibia, knee reduced.  Patella held with a clamp.  Once the cement hardened, Hemovac was placed through a separate stab wound.  Arthrotomy closed with #1 Vicryl.  Skin and subcutaneous tissue with Vicryl and staples.  Sterile compressive dressing applied.  Tourniquet deflated and removed.  Knee immobilizer applied.  Anesthesia reversed.  Brought to the recovery room.  Tolerated the surgery well.  No complications.  Loreta Ave, M.D.     DFM/MEDQ  D:  12/01/2011  T:  12/02/2011  Job:  [id number]

## 2011-12-02 NOTE — Progress Notes (Signed)
OT PROGRESS NOTE  OT order received. Pt plans to participate in rehab at SNF prior to return home. Will defer Ot to SNF. OT signing off. Thank you. [facility], OTR/L  [phone] 12/02/2011

## 2011-12-02 NOTE — Progress Notes (Signed)
Subjective: Doing well.  Pain controlled.  No complaints.  Objective: Vital signs in last 24 hours: Temp:  [97.4 F (36.3 C)-98.4 F (36.9 C)] 98.4 F (36.9 C) (09/26 0555) Pulse Rate:  [86-103] 88  (09/26 0555) Resp:  [8-16] 16  (09/26 0804) BP: (111-155)/(55-96) 121/55 mmHg (09/26 0555) SpO2:  [94 %-100 %] 96 % (09/26 0804)  Intake/Output from previous day: 09/25 0701 - 09/26 0700 In: 2105 [I.V.:1950; IV Piggyback:155] Out: 2300 [Urine:2100; Drains:150; Blood:50] Intake/Output this shift: Total I/O In: 360 [P.O.:360] Out: -    Basename 12/02/11 0540  HGB 10.7*    Basename 12/02/11 0540  WBC 13.9*  RBC 3.81*  HCT 34.0*  PLT 425*    Basename 12/02/11 0540  NA 139  K 5.1  CL 100  CO2 29  BUN 33*  CREATININE 1.12*  GLUCOSE 166*  CALCIUM 9.5    Basename 12/02/11 0540  LABPT --  INR 0.97    Exam:  Slight bleeding through dressing.  Calf nt, nvi.    Assessment/Plan: Change iv to NS.   Transfer to [facility] for rehab Friday or Saturday.     Ragen Laver M 12/02/2011, 8:48 AM

## 2011-12-02 NOTE — Progress Notes (Signed)
Physical Therapy Evaluation Patient Details Name: [name] [name] MRN: [id number] DOB: [birth year] Today's Date: 12/02/2011 Time: 4098-1191 PT Time Calculation (min): 30 min  PT Assessment / Plan / Recommendation Clinical Impression  Pt is a 56 y.o. F s/p L TKA.  Pt will benefit from acute physical therapy to increase endurance, balance, strength, and independence.    PT Assessment  Patient needs continued PT services    Follow Up Recommendations  Skilled nursing facility    Barriers to Discharge Inaccessible home environment;Decreased caregiver support      Equipment Recommendations  None recommended by PT    Recommendations for Other Services Other (comment) (None)   Frequency 7X/week    Precautions / Restrictions Precautions Precautions: Knee Required Braces or Orthoses: Knee Immobilizer - Left Knee Immobilizer - Left: On except when in CPM Restrictions Weight Bearing Restrictions: Yes LLE Weight Bearing: Weight bearing as tolerated   Pertinent Vitals/Pain Reports pain in L LE 4/10      Mobility  Bed Mobility Bed Mobility: Supine to Sit;Sitting - Scoot to Edge of Bed Supine to Sit: 4: Min assist;HOB elevated Sitting - Scoot to [place] of Bed: 4: Min assist Details for Bed Mobility Assistance: (A) with lifting L LE OOB and cues for proper technique and safety Transfers Transfers: Sit to Stand;Stand to Sit Sit to Stand: 4: Min assist;With upper extremity assist;From bed;From toilet Stand to Sit: 4: Min assist;With upper extremity assist;To chair/3-in-1;To toilet Details for Transfer Assistance: (A) with balance and initiating mvt and slowing descent.  Cues for hand placement, foot placement, safety, and proper technique. Ambulation/Gait Ambulation/Gait Assistance: 4: Min assist Ambulation Distance (Feet): 20 Feet Assistive device: Rolling walker Ambulation/Gait Assistance Details: (A) RW placement and cues for proper technique and hand placement Gait Pattern: Step-to  pattern;Decreased stride length;Antalgic Gait velocity: decreased Stairs: No Wheelchair Mobility Wheelchair Mobility: No    Shoulder Instructions     Exercises Total Joint Exercises Ankle Circles/Pumps: AROM;Both;10 reps Quad Sets: AROM;Left;5 reps Heel Slides: AAROM;Left;5 reps   PT Diagnosis: Difficulty walking;Abnormality of gait;Generalized weakness;Acute pain  PT Problem List: Decreased strength;Decreased range of motion;Decreased activity tolerance;Decreased balance;Decreased mobility;Pain PT Treatment Interventions: DME instruction;Gait training;Stair training;Functional mobility training;Therapeutic activities;Therapeutic exercise;Balance training;Neuromuscular re-education   PT Goals Acute Rehab PT Goals PT Goal Formulation: With patient Time For Goal Achievement: 12/09/11 Potential to Achieve Goals: Good Pt will go Supine/Side to Sit: with modified independence;with HOB 0 degrees PT Goal: Supine/Side to Sit - Progress: Goal set today Pt will go Sit to Supine/Side: with modified independence;with HOB 0 degrees PT Goal: Sit to Supine/Side - Progress: Goal set today Pt will go Sit to Stand: with modified independence;with upper extremity assist PT Goal: Sit to Stand - Progress: Goal set today Pt will go Stand to Sit: with modified independence;with upper extremity assist PT Goal: Stand to Sit - Progress: Goal set today Pt will Ambulate: 51 - 150 feet;with supervision;with least restrictive assistive device PT Goal: Ambulate - Progress: Goal set today Pt will Perform Home Exercise Program: Independently PT Goal: Perform Home Exercise Program - Progress: Goal set today  Visit Information  Last PT Received On: 12/02/11 Assistance Needed: +1    Subjective Data  Subjective: Feeling okay  Patient Stated Goal: To be d/c to snf   Prior Functioning  Home Living Lives With: Alone Available Help at Discharge: Skilled Nursing Facility Type of Home: House Home Access: Stairs  to enter [employer] of Steps: 1 Entrance Stairs-Rails: None (hold onto house) Home Layout: One level  Bathroom Shower/Tub: IT trainer: No Home Adaptive Equipment: Bedside commode/3-in-1;Walker - rolling;Straight cane Prior Function Level of Independence: Independent Able to Take Stairs?: Yes Driving: Yes Vocation: On disability Communication Communication: No difficulties Dominant Hand: Left    Cognition  Overall Cognitive Status: Appears within functional limits for tasks assessed/performed Arousal/Alertness: Awake/alert Orientation Level: Appears intact for tasks assessed Behavior During Session: [facility] for tasks performed    Extremity/Trunk Assessment     Balance    End of Session PT - End of Session Equipment Utilized During Treatment: Gait belt;Left knee immobilizer Activity Tolerance: Patient tolerated treatment well Patient left: in chair;with call bell/phone within reach Nurse Communication: Mobility status CPM Left Knee CPM Left Knee: Off  GP     Thais Silberstein 12/02/2011, 9:45 AM

## 2011-12-03 LAB — CBC
HCT: 32 % — ABNORMAL LOW (ref 36.0–46.0)
Hemoglobin: 9.9 g/dL — ABNORMAL LOW (ref 12.0–15.0)
MCH: 27.9 pg (ref 26.0–34.0)
MCHC: 30.9 g/dL (ref 30.0–36.0)
MCV: 90.1 fL (ref 78.0–100.0)
RDW: 14.9 % (ref 11.5–15.5)

## 2011-12-03 LAB — GLUCOSE, CAPILLARY
Glucose-Capillary: 129 mg/dL — ABNORMAL HIGH (ref 70–99)
Glucose-Capillary: 135 mg/dL — ABNORMAL HIGH (ref 70–99)
Glucose-Capillary: 144 mg/dL — ABNORMAL HIGH (ref 70–99)

## 2011-12-03 LAB — BASIC METABOLIC PANEL
BUN: 30 mg/dL — ABNORMAL HIGH (ref 6–23)
Creatinine, Ser: 1.07 mg/dL (ref 0.50–1.10)
GFR calc Af Amer: 65 mL/min — ABNORMAL LOW (ref >90)
GFR calc non Af Amer: 57 mL/min — ABNORMAL LOW (ref >90)
Glucose, Bld: 121 mg/dL — ABNORMAL HIGH (ref 70–99)
Potassium: 3.9 mEq/L (ref 3.5–5.1)

## 2011-12-03 MED ORDER — ENOXAPARIN SODIUM 30 MG/0.3ML ~~LOC~~ SOLN: 30.0000 mg | Freq: Two times a day (BID) | SUBCUTANEOUS | Status: DC

## 2011-12-03 MED ORDER — WARFARIN SODIUM 5 MG PO TABS: 5.0000 mg | ORAL_TABLET | Freq: Every day | ORAL | Status: DC

## 2011-12-03 MED ORDER — MAGNESIUM HYDROXIDE 400 MG/5ML PO SUSP
30.0000 mL | Freq: Every day | ORAL | Status: DC | PRN
  Administered 2011-12-04: 30 mL via ORAL
  Filled 2011-12-03: qty 30

## 2011-12-03 MED ORDER — METHOCARBAMOL 500 MG PO TABS: 500.0000 mg | ORAL_TABLET | Freq: Four times a day (QID) | ORAL | Status: DC | PRN

## 2011-12-03 MED ORDER — OXYCODONE-ACETAMINOPHEN 7.5-325 MG PO TABS: 1.0000 | ORAL_TABLET | ORAL | Status: DC | PRN

## 2011-12-03 MED ORDER — WARFARIN SODIUM 7.5 MG PO TABS
7.5000 mg | ORAL_TABLET | Freq: Once | ORAL | Status: DC
  Filled 2011-12-03: qty 1

## 2011-12-03 NOTE — Discharge Summary (Signed)
Physician Discharge Summary  Patient ID: [name] [name] MRN: [id number] DOB/AGE: December 30, 1955 56 y.o.  Admit date: 12/01/2011 Discharge date: 12/03/2011  Admission Diagnoses:  Left knee djd  Discharge Diagnoses:  Left total knee replacement  Discharged Condition: good  Hospital Course:   56 yo wf with long history of left knee pain.  25 sep 13, pt was taken to the OR for left tkr.  Tolerated procedure well without complication.  Transferred to the ortho unit and protocol coumadin and lovenox started for dvt prophylaxis.  26 sep,  Doing well.  No complaints.  Arranging transfer to [facility] for rehab.  27 sep, doing well.  Wound looks good.  Staples intact.  No drainage or signs of infection.  Calf nontender.  Drain removed.  Ready for transfer Saturday morning.    Consults: None Discharge Exam: Blood pressure 121/74, pulse 97, temperature 98 F (36.7 C), temperature source Oral, resp. rate 16, height 4' 9.87" (1.47 m), weight 106.2 kg (234 lb 2.1 oz), SpO2 92.00%.   Disposition: 03-Skilled Nursing Facility  Discharge Orders    Future Orders Please Complete By Expires   Diet - low sodium heart healthy      Call MD / Call 911      Comments:   If you experience chest pain or shortness of breath, CALL 911 and be transported to the hospital emergency room.  If you develope a fever above 101 F, pus (white drainage) or increased drainage or redness at the wound, or calf pain, call your surgeon's office.   Constipation Prevention      Comments:   Drink plenty of fluids.  Prune juice may be helpful.  You may use a stool softener, such as Colace (over the counter) 100 mg twice a day.  Use MiraLax (over the counter) for constipation as needed.   Increase activity slowly as tolerated      Discharge instructions      Comments:   Ok to shower, but no tub soaking.  Do not apply any creams or ointments to incision.  Continue physical therapy protocol.   Driving restrictions      Comments:   No  driving until further notice.   CPM      Comments:   Continuous passive motion machine (CPM):      Use the CPM from 0 to 70 degrees for 6-8 hours per day.      You may increase by 10 degrees per day as tolerated.  You may break it up into 2 or 3 sessions per day.      Use CPM for 3-4 weeks or until you are told to stop.   TED hose      Comments:   Use stockings (TED hose) for 3-4 weeks on both leg(s).  You may remove them at night for sleeping.   Change dressing      Comments:   Change dressing on left knee daily with sterile 4 x 4 inch gauze dressing and apply TED hose.   Do not put a pillow under the knee. Place it under the heel.          Medication List     As of 12/03/2011  2:10 PM    STOP taking these medications         aspirin EC 81 MG tablet      CO Q 10 PO      diclofenac 75 MG EC tablet   Commonly known as: VOLTAREN  Fish Oil 1200 MG Caps      Flaxseed (Linseed) 1200 MG Caps      GLUCOSAMINE PO      HYDROcodone-acetaminophen 7.5-750 MG per tablet   Commonly known as: VICODIN ES      MAGNESIUM-ZINC PO      RED YEAST RICE PO      TAKE these medications         albuterol 108 (90 BASE) MCG/ACT inhaler   Commonly known as: PROVENTIL HFA;VENTOLIN HFA   Inhale 2 puffs into the lungs every 6 (six) hours as needed. For shortness of breath      cetirizine 10 MG tablet   Commonly known as: ZYRTEC   Take 10 mg by mouth daily.      enoxaparin 30 MG/0.3ML injection   Commonly known as: LOVENOX   Inject 0.3 mLs (30 mg total) into the skin every 12 (twelve) hours.      exenatide 10 MCG/0.04ML Soln   Commonly known as: BYETTA   Inject 10 mcg into the skin 2 (two) times daily with a meal.      ferrous sulfate 325 (65 FE) MG tablet   Take 325 mg by mouth 2 (two) times daily before lunch and supper.      gabapentin 100 MG capsule   Commonly known as: NEURONTIN   Take 100 mg by mouth 2 (two) times daily.      glipiZIDE 5 MG 24 hr tablet   Commonly known  as: GLUCOTROL XL   Take 5 mg by mouth daily.      lisinopril-hydrochlorothiazide 20-25 MG per tablet   Commonly known as: PRINZIDE,ZESTORETIC   Take 1 tablet by mouth daily.      methocarbamol 500 MG tablet   Commonly known as: ROBAXIN   Take 1 tablet (500 mg total) by mouth every 6 (six) hours as needed (spasms).      oxyCODONE-acetaminophen 7.5-325 MG per tablet   Commonly known as: PERCOCET   Take 1-2 tablets by mouth every 4 (four) hours as needed for pain.      pantoprazole 40 MG tablet   Commonly known as: PROTONIX   Take 40 mg by mouth daily.      ULORIC 80 MG Tabs   Generic drug: Febuxostat   Take 80 mg by mouth daily.      venlafaxine XR 75 MG 24 hr capsule   Commonly known as: EFFEXOR-XR   Take 75 mg by mouth daily.      Vitamin D (Ergocalciferol) 50000 UNITS Caps   Commonly known as: DRISDOL   Take 50,000 Units by mouth every 7 (seven) days. On sunday      warfarin 5 MG tablet   Commonly known as: COUMADIN   Take 1 tablet (5 mg total) by mouth daily.         SignedNaida Sleight 12/03/2011, 2:10 PM

## 2011-12-03 NOTE — Progress Notes (Signed)
ANTICOAGULATION CONSULT - COUMADIN  Pharmacy Consult for Coumadin  HPI: 56 y.o.female admitted for DJD LEFT KNEE now s/p L-TKA who is currently on Coumadin for VTE prophylaxis .  Allergies: Allergies  Allergen Reactions  . Statins Other (See Comments)    Leg pain  . Tetracycline     REACTION: hepatitis    Height/Weight: Height: 4' 9.87" (147 cm) Weight: 234 lb 2.1 oz (106.2 kg) IBW/kg (Calculated) : 40.61   Vitals: Blood pressure 121/74, pulse 97, temperature 98 F (36.7 C), temperature source Oral, resp. rate 16, height 4' 9.87" (1.47 m), weight 234 lb 2.1 oz (106.2 kg), SpO2 92.00%.  Medical / Surgical History: Past Medical History  Diagnosis Date  . Gout   . Hypertension   . Diabetes mellitus   . Asthma   . Complication of anesthesia   . PONV (postoperative nausea and vomiting)   . Depression   . Bronchitis     hx of  . Renal disorder     "medication induced"  . GERD (gastroesophageal reflux disease)   . Benign skin lesion     removed by dermatologist  . Arthritis   . Hepatitis     hep A  . Chronic low back pain     hx of  . Chronic neck pain     hx of  . Cataract     left   Past Surgical History  Procedure Date  . Cesarean section   . Total knee arthroplasty 08/18/2011    Procedure: TOTAL KNEE ARTHROPLASTY;  Surgeon: Loreta Ave, MD;  Location: [facility] OR;  Service: Orthopedics;  Laterality: Right;  right total knee arthroplasty  . Carpal tunnel release     bilaterally  . Back surgery     "stenosis"  . Knee arthroscopy     right  . Total knee arthroplasty 12/01/2011    Procedure: TOTAL KNEE ARTHROPLASTY;  Surgeon: Loreta Ave, MD;  Location: [facility] OR;  Service: Orthopedics;  Laterality: Left;  left total knee arthroplasty    Current Labs:   [facility] 12/03/11 0635 12/02/11 1513 12/02/11 0540  HGB 9.9* -- 10.7*  HCT 32.0* -- 34.0*  PLT 382 -- 425*  LABPROT 14.7 -- 12.8  INR 1.17 -- 0.97  CREATININE 1.07 1.22* 1.12*    Lab Results    Component Value Date   INR 1.17 12/03/2011   INR 0.97 12/02/2011   INR 0.99 11/24/2011   Estimated Creatinine Clearance: 61.2 ml/min (by C-G formula based on Cr of 1.07).  Pertinent Medication History: Prescriptions prior to admission  Medication Sig Dispense Refill  . albuterol (PROVENTIL HFA;VENTOLIN HFA) 108 (90 BASE) MCG/ACT inhaler Inhale 2 puffs into the lungs every 6 (six) hours as needed. For shortness of breath      . aspirin EC 81 MG tablet Take 81 mg by mouth daily.      . cetirizine (ZYRTEC) 10 MG tablet Take 10 mg by mouth daily.        . Coenzyme Q10 (CO Q 10 PO) Take 1 capsule by mouth 2 (two) times daily.      . diclofenac (VOLTAREN) 75 MG EC tablet Take 75 mg by mouth daily.      Marland Kitchen exenatide (BYETTA) 10 MCG/0.04ML SOLN Inject 10 mcg into the skin 2 (two) times daily with a meal.      . Febuxostat (ULORIC) 80 MG TABS Take 80 mg by mouth daily.       . ferrous sulfate 325 (65 FE) MG  tablet Take 325 mg by mouth 2 (two) times daily before lunch and supper.      . Flaxseed, Linseed, 1200 MG CAPS Take 1,200 mg by mouth 2 (two) times daily.      Marland Kitchen gabapentin (NEURONTIN) 100 MG capsule Take 100 mg by mouth 2 (two) times daily.        Marland Kitchen glipiZIDE (GLUCOTROL XL) 5 MG 24 hr tablet Take 5 mg by mouth daily.        Marland Kitchen GLUCOSAMINE PO Take 1,800 mg by mouth 2 (two) times daily.      Marland Kitchen HYDROcodone-acetaminophen (VICODIN ES) 7.5-750 MG per tablet Take 1 tablet by mouth every 6 (six) hours as needed. For pain      . lisinopril-hydrochlorothiazide (PRINZIDE,ZESTORETIC) 20-25 MG per tablet Take 1 tablet by mouth daily.      Marland Kitchen MAGNESIUM-ZINC PO Take 1 tablet by mouth 2 (two) times daily.      . Omega-3 Fatty Acids (FISH OIL) 1200 MG CAPS Take 1 capsule by mouth 2 (two) times daily.       . pantoprazole (PROTONIX) 40 MG tablet Take 40 mg by mouth daily.        . Red Yeast Rice Extract (RED YEAST RICE PO) Take 1 tablet by mouth daily.      Marland Kitchen venlafaxine (EFFEXOR-XR) 75 MG 24 hr capsule Take 75 mg  by mouth daily.        . Vitamin D, Ergocalciferol, (DRISDOL) 50000 UNITS CAPS Take 50,000 Units by mouth every 7 (seven) days. On sunday       Scheduled:    . docusate sodium  100 mg Oral BID  . enoxaparin (LOVENOX) injection  30 mg Subcutaneous Q12H  . exenatide  10 mcg Subcutaneous BID WC  . ferrous sulfate  325 mg Oral BID AC  . gabapentin  100 mg Oral BID  . glipiZIDE  5 mg Oral Daily  . hydrochlorothiazide  25 mg Oral Daily  . insulin aspart  0-15 Units Subcutaneous TID WC  . loratadine  10 mg Oral Daily  . pantoprazole  40 mg Oral Daily  . venlafaxine XR  75 mg Oral Daily  . warfarin  5 mg Oral ONCE-1800  . warfarin   Does not apply Once  . Warfarin - Pharmacist Dosing Inpatient   Does not apply q1800    Assessment:  INR 1.17,  H/H -  9.9/32.  No complications noted.  Patient reviewed Coumadin discharge instructions.  Goals:  Target INR of 2-3.  Plan:  Will give Coumadin 7.5 mg today.    Daily INR's, CBC. Warf ED completed.  Lavern Crimi, Elisha Headland, Pharm. D. 12/03/2011, 10:27 AM

## 2011-12-03 NOTE — Progress Notes (Signed)
Subjective: Doing well.  No complaints.  Ready for transfer to [facility] tomorrow.  Objective: Vital signs in last 24 hours: Temp:  [98 F (36.7 C)] 98 F (36.7 C) (09/27 0553) Pulse Rate:  [88-97] 97  (09/27 0553) Resp:  [16] 16  (09/27 0553) BP: (121-144)/(74-83) 121/74 mmHg (09/27 0553) SpO2:  [92 %-99 %] 92 % (09/27 0553)  Intake/Output from previous day: 09/26 0701 - 09/27 0700 In: 1346.4 [P.O.:360; I.V.:986.4] Out: 25 [Drains:25] Intake/Output this shift:     [facility]ename 12/03/11 0635 12/02/11 0540  HGB 9.9* 10.7*    Basename 12/03/11 0635 12/02/11 0540  WBC 14.3* 13.9*  RBC 3.55* 3.81*  HCT 32.0* 34.0*  PLT 382 425*    Basename 12/03/11 0635 12/02/11 1513  NA 140 138  K 3.9 4.4  CL 102 101  CO2 29 25  BUN 30* 35*  CREATININE 1.07 1.22*  GLUCOSE 121* 196*  CALCIUM 9.1 9.0    Basename 12/03/11 0635 12/02/11 0540  LABPT -- --  INR 1.17 0.97    Exam:  Wound looks good.  Staples intact.  No drainage or signs of infection.  Drain removed.  Calf nt.   Assessment/Plan: Saline lock iv.  Transfer to [facility] tomorrow.   Tamla Winkels M 12/03/2011, 2:15 PM

## 2011-12-03 NOTE — Progress Notes (Signed)
Physical Therapy Treatment Patient Details Name: [name] [name] MRN: [id number] DOB: November 08, 1955 Today's Date: 12/03/2011 Time: 6213-0865 PT Time Calculation (min): 14 min  PT Assessment / Plan / Recommendation Comments on Treatment Session  Limited session due to MD entering room to d/c Hemo VAC.  Pt very pleasant and waiting to here d/c for [facility] place.  Will be back for further treatment and ambulation.    Follow Up Recommendations  Skilled nursing facility    Barriers to Discharge  NONE      Equipment Recommendations  None recommended by PT    Recommendations for Other Services Other (comment)  Frequency 7X/week   Plan Discharge plan remains appropriate;Frequency remains appropriate    Precautions / Restrictions Precautions Precautions: Knee Required Braces or Orthoses: Knee Immobilizer - Left Knee Immobilizer - Left: On except when in CPM Restrictions Weight Bearing Restrictions: Yes LLE Weight Bearing: Weight bearing as tolerated   Pertinent Vitals/Pain 5/10 left LE    Mobility  Bed Mobility Bed Mobility: Not assessed Transfers Transfers: Not assessed Ambulation/Gait Ambulation/Gait Assistance: Not tested (comment)    Exercises Total Joint Exercises Ankle Circles/Pumps: AROM;Both;10 reps Quad Sets: AROM;Left;10 reps Heel Slides: AAROM;Left;10 reps   PT Diagnosis:    PT Problem List:   PT Treatment Interventions:     PT Goals Acute Rehab PT Goals PT Goal Formulation: With patient  Visit Information  Last PT Received On: 12/03/11 Assistance Needed: +1    Subjective Data  Subjective: I'm doing alright today. Patient Stated Goal: To go to rehab then eventually home   Cognition  Overall Cognitive Status: Appears within functional limits for tasks assessed/performed Arousal/Alertness: Awake/alert Orientation Level: Appears intact for tasks assessed Behavior During Session: [facility] for tasks performed    Balance     End of Session PT - End of  Session Activity Tolerance: Patient tolerated treatment well Patient left: in chair;with call bell/phone within reach Nurse Communication: Mobility status   GP     [place] 12/03/2011, 9:04 AM Jake Shark, PT DPT [phone]

## 2011-12-03 NOTE — Progress Notes (Signed)
Physical Therapy Progress Note   12/03/11 0918  PT Visit Information  Last PT Received On 12/03/11  Assistance Needed +1  PT Time Calculation  PT Start Time 0857  PT Stop Time 0920  PT Time Calculation (min) 23 min  Subjective Data  Subjective "It hurt when he pulled that drain.  But   Patient Stated Goal To go to rehab then eventually home  Precautions  Precautions Knee  Required Braces or Orthoses Knee Immobilizer - Left  Knee Immobilizer - Left On except when in CPM  Restrictions  Weight Bearing Restrictions Yes  LLE Weight Bearing WBAT  Cognition  Overall Cognitive Status Appears within functional limits for tasks assessed/performed  Arousal/Alertness Awake/alert  Orientation Level Appears intact for tasks assessed  Behavior During Session [facility] for tasks performed  Bed Mobility  Bed Mobility Not assessed  Transfers  Transfers Sit to Stand;Stand to Sit  Sit to Stand 4: Min assist  Stand to Sit 4: Min guard  Details for Transfer Assistance (A) to initiate transfer with max cues for hand and LE placement.  (A) to maintain balance  Ambulation/Gait  Ambulation/Gait Assistance 4: Min guard  Ambulation Distance (Feet) 200 Feet  Assistive device Rolling walker  Ambulation/Gait Assistance Details Minguard for safety with cues for step sequence.  Pt continues to have antalgic gait with decrease stance time on left LE.  Gait Pattern Step-through pattern;Decreased stride length;Antalgic  Gait velocity decreased  Stairs No  Wheelchair Mobility  Wheelchair Mobility No  PT - End of Session  Equipment Utilized During Treatment Gait belt;Left knee immobilizer  Activity Tolerance Patient tolerated treatment well  Patient left in chair;with call bell/phone within reach  Nurse Communication Mobility status  PT - Assessment/Plan  Comments on Treatment Session Pt able to improve overall gait distance and sequence.  Pt continues to be motivated and willing to work hard.  Continue to  recommend SNF at d/c.  PT Plan Discharge plan remains appropriate;Frequency remains appropriate  PT Frequency 7X/week  Recommendations for Other Services Other (comment)  Follow Up Recommendations Skilled nursing facility  Equipment Recommended None recommended by PT  Acute Rehab PT Goals  PT Goal Formulation With patient  Time For Goal Achievement 12/09/11  Potential to Achieve Goals Good  Pt will go Sit to Stand with modified independence;with upper extremity assist  PT Goal: Sit to Stand - Progress Progressing toward goal  Pt will go Stand to Sit with modified independence;with upper extremity assist  PT Goal: Stand to Sit - Progress Progressing toward goal  Pt will Ambulate 51 - 150 feet;with supervision;with least restrictive assistive device  PT Goal: Ambulate - Progress Progressing toward goal  PT General Charges  $$ ACUTE PT VISIT 1 Procedure  PT Treatments  $Gait Training 8-22 mins  $Therapeutic Activity 8-22 mins    Pt did not rate pain however commented on pain improving today.  [place], [place] DPT [phone]

## 2011-12-04 LAB — CBC
HCT: 29.9 % — ABNORMAL LOW (ref 36.0–46.0)
Hemoglobin: 9.6 g/dL — ABNORMAL LOW (ref 12.0–15.0)
MCHC: 32.1 g/dL (ref 30.0–36.0)
RBC: 3.38 MIL/uL — ABNORMAL LOW (ref 3.87–5.11)
WBC: 14.9 10*3/uL — ABNORMAL HIGH (ref 4.0–10.5)

## 2011-12-04 LAB — BASIC METABOLIC PANEL
BUN: 22 mg/dL (ref 6–23)
CO2: 26 mEq/L (ref 19–32)
Chloride: 99 mEq/L (ref 96–112)
Glucose, Bld: 145 mg/dL — ABNORMAL HIGH (ref 70–99)
Potassium: 4.2 mEq/L (ref 3.5–5.1)

## 2011-12-04 LAB — GLUCOSE, CAPILLARY
Glucose-Capillary: 155 mg/dL — ABNORMAL HIGH (ref 70–99)
Glucose-Capillary: 121 mg/dL — ABNORMAL HIGH (ref 70–99)

## 2011-12-04 LAB — PROTIME-INR: INR: 1.16 (ref 0.00–1.49)

## 2011-12-04 MED ORDER — WARFARIN SODIUM 10 MG PO TABS
10.0000 mg | ORAL_TABLET | Freq: Once | ORAL | Status: DC
  Filled 2011-12-04: qty 1

## 2011-12-04 NOTE — Progress Notes (Signed)
Pt to transfer to [facility] today via [facility]. Pt, pt's family, and SNF aware of discharge. D/C packet complete with signed hard Rx, chart copy, and signed FL2. CSW signing off as no other CSW needs identified.  Dellie Burns, MSW, LCSWA [phone] (Weekends 8:00am-4:30pm)

## 2011-12-04 NOTE — Progress Notes (Signed)
ANTICOAGULATION CONSULT - COUMADIN  Pharmacy Consult for Coumadin  HPI: 56 y.o.female admitted for DJD LEFT KNEE now s/p L-TKA who is currently on Coumadin for VTE prophylaxis .  Allergies: Allergies  Allergen Reactions  . Statins Other (See Comments)    Leg pain  . Tetracycline     REACTION: hepatitis    Height/Weight: Height: 4' 9.87" (147 cm) Weight: 234 lb 2.1 oz (106.2 kg) IBW/kg (Calculated) : 40.61   Vitals: Blood pressure 143/78, pulse 112, temperature 99.8 F (37.7 C), temperature source Oral, resp. rate 18, height 4' 9.87" (1.47 m), weight 234 lb 2.1 oz (106.2 kg), SpO2 95.00%.  Medical / Surgical History: Past Medical History  Diagnosis Date  . Gout   . Hypertension   . Diabetes mellitus   . Asthma   . Complication of anesthesia   . PONV (postoperative nausea and vomiting)   . Depression   . Bronchitis     hx of  . Renal disorder     "medication induced"  . GERD (gastroesophageal reflux disease)   . Benign skin lesion     removed by dermatologist  . Arthritis   . Hepatitis     hep A  . Chronic low back pain     hx of  . Chronic neck pain     hx of  . Cataract     left   Past Surgical History  Procedure Date  . Cesarean section   . Total knee arthroplasty 08/18/2011    Procedure: TOTAL KNEE ARTHROPLASTY;  Surgeon: Loreta Ave, MD;  Location: [facility] OR;  Service: Orthopedics;  Laterality: Right;  right total knee arthroplasty  . Carpal tunnel release     bilaterally  . Back surgery     "stenosis"  . Knee arthroscopy     right  . Total knee arthroplasty 12/01/2011    Procedure: TOTAL KNEE ARTHROPLASTY;  Surgeon: Loreta Ave, MD;  Location: [facility] OR;  Service: Orthopedics;  Laterality: Left;  left total knee arthroplasty    Current Labs:   [facility] 12/03/11 0635 12/02/11 1513 12/02/11 0540  HGB 9.9* -- 10.7*  HCT 32.0* -- 34.0*  PLT 382 -- 425*  LABPROT 14.7 -- 12.8  INR 1.17 -- 0.97  CREATININE 1.07 1.22* 1.12*    Lab Results    Component Value Date   INR 1.16 12/04/2011   INR 1.17 12/03/2011   INR 0.97 12/02/2011   Estimated Creatinine Clearance: 80.8 ml/min (by C-G formula based on Cr of 0.81).   Assessment:  INR 1.16,  H/H -  9.9/32.  No complications noted.  Last nights Coumadin not charted as given and INR is slightly down today.  D/C today planned  Goals:  Target INR of 2-3.  Plan:  Will give Coumadin 10 mg today if not D/Ced first.    Daily INR's, CBC.   Vania Rea. Darin Engels.D. Clinical Pharmacist Pager [phone] Phone [phone] 12/04/2011 8:47 AM

## 2011-12-04 NOTE — Progress Notes (Signed)
Physical Therapy Treatment Patient Details Name: [name] [name] MRN: [id number] DOB: 1955/06/29 Today's Date: 12/04/2011 Time: 1308-6578 PT Time Calculation (min): 24 min  PT Assessment / Plan / Recommendation Comments on Treatment Session  Patient was able to ambulate to and from bathroom, transfer multiple times for various surfaces including 3in1; bed and chair.  Patient was able to tolerate increased ambulation distance but did require rest breaks.  Patient continues to make steady progress towards PT goals at this time.    Follow Up Recommendations  Skilled nursing facility    Barriers to Discharge        Equipment Recommendations  None recommended by PT    Recommendations for Other Services    Frequency 7X/week   Plan Discharge plan remains appropriate;Frequency remains appropriate    Precautions / Restrictions Precautions Precautions: Knee Required Braces or Orthoses: Knee Immobilizer - Left Knee Immobilizer - Left: On except when in CPM Restrictions Weight Bearing Restrictions: Yes LLE Weight Bearing: Weight bearing as tolerated   Pertinent Vitals/Pain 5/10 pre activity; 7/10 during activity    Mobility  Bed Mobility Bed Mobility: Supine to Sit;Sitting - Scoot to Edge of Bed Supine to Sit: 4: Min assist;HOB elevated Sitting - Scoot to [place] of Bed: 4: Min assist Details for Bed Mobility Assistance: Assist for RLE Transfers Transfers: Sit to Stand;Stand to Sit Sit to Stand: 4: Min assist Stand to Sit: 4: Min guard Details for Transfer Assistance: Assist to stand from bed Ambulation/Gait Ambulation/Gait Assistance: 4: Min guard;5: Supervision Ambulation Distance (Feet): 200 Feet (15 ft to bathroom prior to ambulation) Assistive device: Rolling walker Ambulation/Gait Assistance Details: Min guard initially for stability, progressed to supervision as ambulation increased Gait Pattern: Step-through pattern;Decreased stride length;Antalgic Gait velocity:  decreased Stairs: No    Exercises Total Joint Exercises Ankle Circles/Pumps: AROM;Both;20 reps   PT Diagnosis:    PT Problem List:   PT Treatment Interventions:     PT Goals Acute Rehab PT Goals PT Goal Formulation: With patient Time For Goal Achievement: 12/09/11 Potential to Achieve Goals: Good Pt will go Supine/Side to Sit: with modified independence;with HOB 0 degrees PT Goal: Supine/Side to Sit - Progress: Progressing toward goal Pt will go Sit to Stand: with modified independence;with upper extremity assist PT Goal: Sit to Stand - Progress: Progressing toward goal Pt will go Stand to Sit: with modified independence;with upper extremity assist PT Goal: Stand to Sit - Progress: Progressing toward goal Pt will Ambulate: 51 - 150 feet;with supervision;with least restrictive assistive device PT Goal: Ambulate - Progress: Progressing toward goal Pt will Perform Home Exercise Program: Independently PT Goal: Perform Home Exercise Program - Progress: Progressing toward goal  Visit Information  Last PT Received On: 12/04/11 Assistance Needed: +1    Subjective Data  Subjective: I would like to use the bathroom Patient Stated Goal: To go to rehab then eventually home   Cognition  Overall Cognitive Status: Appears within functional limits for tasks assessed/performed Arousal/Alertness: Awake/alert Orientation Level: Appears intact for tasks assessed Behavior During Session: [facility] for tasks performed    Balance     End of Session PT - End of Session Equipment Utilized During Treatment: Gait belt;Left knee immobilizer Activity Tolerance: Patient tolerated treatment well;Patient limited by fatigue Patient left: in chair;with call bell/phone within reach Nurse Communication: Mobility status   GP     Fabio Asa 12/04/2011, 8:56 AM Charlotte Crumb, PT DPT  [phone]

## 2012-04-12 ENCOUNTER — Encounter (HOSPITAL_COMMUNITY): Payer: Self-pay | Admitting: Pharmacy Technician

## 2012-04-12 NOTE — Patient Instructions (Addendum)
Your procedure is scheduled on: 04/17/2012  Report to [facility] at  0730    AM.  Call this number if you have problems the morning of surgery: [phone]   Do not eat food or drink liquids :After Midnight.      Take these medicines the morning of surgery with A SIP OF WATER: zyrtec,voltaren,neurontin,lisinopril,protonix,effexor   Do not wear jewelry, make-up or nail polish.  Do not wear lotions, powders, or perfumes.   Do not shave 48 hours prior to surgery.  Do not bring valuables to the hospital.  Contacts, dentures or bridgework may not be worn into surgery.  Leave suitcase in the car. After surgery it may be brought to your room.  For patients admitted to the hospital, checkout time is 11:00 AM the day of discharge.   Patients discharged the day of surgery will not be allowed to drive home.  :     Please read over the following fact sheets that you were given: Coughing and Deep Breathing, Surgical Site Infection Prevention, Anesthesia Post-op Instructions and Care and Recovery After Surgery    Cataract A cataract is a clouding of the lens of the eye. When a lens becomes cloudy, vision is reduced based on the degree and nature of the clouding. Many cataracts reduce vision to some degree. Some cataracts make people more near-sighted as they develop. Other cataracts increase glare. Cataracts that are ignored and become worse can sometimes look white. The white color can be seen through the pupil. CAUSES   Aging. However, cataracts may occur at any age, even in newborns.   Certain drugs.   Trauma to the eye.   Certain diseases such as diabetes.   Specific eye diseases such as chronic inflammation inside the eye or a sudden attack of a rare form of glaucoma.   Inherited or acquired medical problems.  SYMPTOMS   Gradual, progressive drop in vision in the affected eye.   Severe, rapid visual loss. This most often happens when trauma is the cause.  DIAGNOSIS  To detect a  cataract, an eye doctor examines the lens. Cataracts are best diagnosed with an exam of the eyes with the pupils enlarged (dilated) by drops.  TREATMENT  For an early cataract, vision may improve by using different eyeglasses or stronger lighting. If that does not help your vision, surgery is the only effective treatment. A cataract needs to be surgically removed when vision loss interferes with your everyday activities, such as driving, reading, or watching TV. A cataract may also have to be removed if it prevents examination or treatment of another eye problem. Surgery removes the cloudy lens and usually replaces it with a substitute lens (intraocular lens, IOL).  At a time when both you and your doctor agree, the cataract will be surgically removed. If you have cataracts in both eyes, only one is usually removed at a time. This allows the operated eye to heal and be out of danger from any possible problems after surgery (such as infection or poor wound healing). In rare cases, a cataract may be doing damage to your eye. In these cases, your caregiver may advise surgical removal right away. The vast majority of people who have cataract surgery have better vision afterward. HOME CARE INSTRUCTIONS  If you are not planning surgery, you may be asked to do the following:  Use different eyeglasses.   Use stronger or brighter lighting.   Ask your eye doctor about reducing your medicine dose or changing  medicines if it is thought that a medicine caused your cataract. Changing medicines does not make the cataract go away on its own.   Become familiar with your surroundings. Poor vision can lead to injury. Avoid bumping into things on the affected side. You are at a higher risk for tripping or falling.   Exercise extreme care when driving or operating machinery.   Wear sunglasses if you are sensitive to bright light or experiencing problems with glare.  SEEK IMMEDIATE MEDICAL CARE IF:   You have a  worsening or sudden vision loss.   You notice redness, swelling, or increasing pain in the eye.   You have a fever.  Document Released: 02/22/2005 Document Revised: 02/11/2011 Document Reviewed: 10/16/2010 [facility] Patient Information 2012 [place], [place].PATIENT INSTRUCTIONS POST-ANESTHESIA  IMMEDIATELY FOLLOWING SURGERY:  Do not drive or operate machinery for the first twenty four hours after surgery.  Do not make any important decisions for twenty four hours after surgery or while taking narcotic pain medications or sedatives.  If you develop intractable nausea and vomiting or a severe headache please notify your doctor immediately.  FOLLOW-UP:  Please make an appointment with your surgeon as instructed. You do not need to follow up with anesthesia unless specifically instructed to do so.  WOUND CARE INSTRUCTIONS (if applicable):  Keep a dry clean dressing on the anesthesia/puncture wound site if there is drainage.  Once the wound has quit draining you may leave it open to air.  Generally you should leave the bandage intact for twenty four hours unless there is drainage.  If the epidural site drains for more than 36-48 hours please call the anesthesia department.  QUESTIONS?:  Please feel free to call your physician or the hospital operator if you have any questions, and they will be happy to assist you.

## 2012-04-13 ENCOUNTER — Encounter (HOSPITAL_COMMUNITY)
Admission: RE | Admit: 2012-04-13 | Discharge: 2012-04-13 | Disposition: A | Discharge status: Home / Self Care | Payer: Medicare Other | Source: Ambulatory Visit | Attending: Ophthalmology | Admitting: Ophthalmology

## 2012-04-13 ENCOUNTER — Encounter (HOSPITAL_COMMUNITY): Payer: Self-pay

## 2012-04-13 ENCOUNTER — Other Ambulatory Visit: Payer: Self-pay

## 2012-04-13 LAB — BASIC METABOLIC PANEL
BUN: 52 mg/dL — ABNORMAL HIGH (ref 6–23)
CO2: 28 mEq/L (ref 19–32)
Calcium: 10.1 mg/dL (ref 8.4–10.5)
Chloride: 103 mEq/L (ref 96–112)
Creatinine, Ser: 1.4 mg/dL — ABNORMAL HIGH (ref 0.50–1.10)

## 2012-04-14 MED ORDER — LIDOCAINE HCL 3.5 % OP GEL
OPHTHALMIC | Status: AC
  Filled 2012-04-14: qty 5

## 2012-04-14 MED ORDER — NEOMYCIN-POLYMYXIN-DEXAMETH 3.5-10000-0.1 OP OINT
TOPICAL_OINTMENT | OPHTHALMIC | Status: AC
  Filled 2012-04-14: qty 3.5

## 2012-04-14 MED ORDER — PHENYLEPHRINE HCL 2.5 % OP SOLN
OPHTHALMIC | Status: AC
  Filled 2012-04-14: qty 2

## 2012-04-14 MED ORDER — CYCLOPENTOLATE-PHENYLEPHRINE 0.2-1 % OP SOLN
OPHTHALMIC | Status: AC
  Filled 2012-04-14: qty 2

## 2012-04-14 MED ORDER — TETRACAINE HCL 0.5 % OP SOLN
OPHTHALMIC | Status: AC
  Filled 2012-04-14: qty 2

## 2012-04-14 MED ORDER — LIDOCAINE HCL (PF) 1 % IJ SOLN
INTRAMUSCULAR | Status: AC
  Filled 2012-04-14: qty 2

## 2012-04-17 ENCOUNTER — Encounter (HOSPITAL_COMMUNITY): Payer: Self-pay | Admitting: *Deleted

## 2012-04-17 ENCOUNTER — Encounter (HOSPITAL_COMMUNITY)
Admission: RE | Disposition: A | Discharge status: Home / Self Care | Payer: Self-pay | Source: Ambulatory Visit | Attending: Ophthalmology

## 2012-04-17 ENCOUNTER — Ambulatory Visit (HOSPITAL_COMMUNITY)
Admission: RE | Admit: 2012-04-17 | Discharge: 2012-04-17 | Disposition: A | Discharge status: Home / Self Care | Payer: Medicare Other | Source: Ambulatory Visit | Attending: Ophthalmology | Admitting: Ophthalmology

## 2012-04-17 ENCOUNTER — Ambulatory Visit (HOSPITAL_COMMUNITY): Payer: Medicare Other | Admitting: Anesthesiology

## 2012-04-17 ENCOUNTER — Encounter (HOSPITAL_COMMUNITY): Payer: Self-pay | Admitting: Anesthesiology

## 2012-04-17 DIAGNOSIS — Z01812 Encounter for preprocedural laboratory examination: Secondary | ICD-10-CM | POA: Insufficient documentation

## 2012-04-17 DIAGNOSIS — I10 Essential (primary) hypertension: Secondary | ICD-10-CM | POA: Insufficient documentation

## 2012-04-17 DIAGNOSIS — E119 Type 2 diabetes mellitus without complications: Secondary | ICD-10-CM | POA: Insufficient documentation

## 2012-04-17 DIAGNOSIS — H2510 Age-related nuclear cataract, unspecified eye: Secondary | ICD-10-CM | POA: Insufficient documentation

## 2012-04-17 DIAGNOSIS — Z01810 Encounter for preprocedural cardiovascular examination: Secondary | ICD-10-CM | POA: Insufficient documentation

## 2012-04-17 HISTORY — PX: CATARACT EXTRACTION W/PHACO: SHX586

## 2012-04-17 SURGERY — PHACOEMULSIFICATION, CATARACT, WITH IOL INSERTION
Anesthesia: Monitor Anesthesia Care | Site: Eye | Laterality: Left | Wound class: Clean

## 2012-04-17 MED ORDER — TETRACAINE HCL 0.5 % OP SOLN
1.0000 [drp] | OPHTHALMIC | Status: AC
  Administered 2012-04-17 (×3): 1 [drp] via OPHTHALMIC

## 2012-04-17 MED ORDER — CYCLOPENTOLATE-PHENYLEPHRINE 0.2-1 % OP SOLN
1.0000 [drp] | OPHTHALMIC | Status: AC
  Administered 2012-04-17 (×3): 1 [drp] via OPHTHALMIC

## 2012-04-17 MED ORDER — MIDAZOLAM HCL 2 MG/2ML IJ SOLN
1.0000 mg | INTRAMUSCULAR | Status: DC | PRN
  Administered 2012-04-17: 2 mg via INTRAVENOUS

## 2012-04-17 MED ORDER — PHENYLEPHRINE HCL 2.5 % OP SOLN
1.0000 [drp] | OPHTHALMIC | Status: AC
  Administered 2012-04-17 (×3): 1 [drp] via OPHTHALMIC

## 2012-04-17 MED ORDER — MIDAZOLAM HCL 2 MG/2ML IJ SOLN
INTRAMUSCULAR | Status: AC
  Filled 2012-04-17: qty 2

## 2012-04-17 MED ORDER — EPINEPHRINE HCL 1 MG/ML IJ SOLN
INTRAOCULAR | Status: DC | PRN
  Administered 2012-04-17: 09:00:00

## 2012-04-17 MED ORDER — POVIDONE-IODINE 5 % OP SOLN
OPHTHALMIC | Status: DC | PRN
  Administered 2012-04-17: 1 via OPHTHALMIC

## 2012-04-17 MED ORDER — FENTANYL CITRATE 0.05 MG/ML IJ SOLN: 25.0000 ug | INTRAMUSCULAR | Status: DC | PRN

## 2012-04-17 MED ORDER — ONDANSETRON HCL 4 MG/2ML IJ SOLN: 4.0000 mg | Freq: Once | INTRAMUSCULAR | Status: DC | PRN

## 2012-04-17 MED ORDER — LIDOCAINE 3.5 % OP GEL OPTIME - NO CHARGE
OPHTHALMIC | Status: DC | PRN
  Administered 2012-04-17: 2 [drp] via OPHTHALMIC

## 2012-04-17 MED ORDER — NA HYALUR & NA CHOND-NA HYALUR 0.55-0.5 ML IO KIT
PACK | INTRAOCULAR | Status: DC | PRN
  Administered 2012-04-17: 1 via OPHTHALMIC

## 2012-04-17 MED ORDER — BSS IO SOLN
INTRAOCULAR | Status: DC | PRN
  Administered 2012-04-17: 15 mL via INTRAOCULAR

## 2012-04-17 MED ORDER — NEOMYCIN-POLYMYXIN-DEXAMETH 0.1 % OP OINT
TOPICAL_OINTMENT | OPHTHALMIC | Status: DC | PRN
  Administered 2012-04-17: 1 via OPHTHALMIC

## 2012-04-17 MED ORDER — PROVISC 10 MG/ML IO SOLN: INTRAOCULAR | Status: DC | PRN

## 2012-04-17 MED ORDER — EPINEPHRINE HCL 1 MG/ML IJ SOLN
INTRAMUSCULAR | Status: AC
  Filled 2012-04-17: qty 1

## 2012-04-17 MED ORDER — LIDOCAINE HCL 3.5 % OP GEL
1.0000 "application " | Freq: Once | OPHTHALMIC | Status: AC
  Administered 2012-04-17: 1 via OPHTHALMIC

## 2012-04-17 MED ORDER — LIDOCAINE HCL (PF) 1 % IJ SOLN
INTRAMUSCULAR | Status: DC | PRN
  Administered 2012-04-17: .5 mL

## 2012-04-17 MED ORDER — LACTATED RINGERS IV SOLN
INTRAVENOUS | Status: DC
  Administered 2012-04-17: 09:00:00 via INTRAVENOUS

## 2012-04-17 SURGICAL SUPPLY — 32 items

## 2012-04-17 NOTE — H&P (Signed)
I have reviewed the H&P, the patient was re-examined, and I have identified no interval changes in medical condition and plan of care since the history and physical of record  

## 2012-04-17 NOTE — Op Note (Signed)
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  ANESTHESIA:  Topical with IV sedation and monitored anesthesia care.  OPERATION PERFORMED:  Phacoemulsification of posterior chamber intraocular lens implantation, left eye.  OPERATIVE SUMMARY:  In the preoperative area, dilating drops were placed into the left eye.  The patient was then brought into the operating room where she was placed under topical anesthesia and IV sedation.  The eye was then prepped and draped.  Beginning with a 75 blade, a paracentesis port was made at the surgeon's 2 o'clock position.  The anterior chamber was then filled with a 1% nonpreserved lidocaine solution with epinephrine.  This was followed by Viscoat to deepen the chamber.  A small fornix-based peritomy was performed superiorly.  Next, a single iris hook was placed through the limbus superiorly.  A 2.4-mm keratome blade was then used to make a clear corneal incision over the iris hook. A bent cystotome needle and Utrata forceps were used to create a continuous tear capsulotomy.  Hydrodissection was performed using balanced salt solution on a fine cannula.  The lens nucleus was then removed using phacoemulsification in a quadrant cracking technique.  The cortical material was then removed with irrigation and aspiration.  The capsular bag and anterior chamber were refilled with Provisc.  The wound was widened to approximately 3 mm and a posterior chamber intraocular lens was placed into the capsular bag without difficulty  using an [employer] lens injecting system.  A single 10-0 nylon suture was then used to close the incision as well as stromal hydration.  The Provisc was removed from the anterior chamber and capsular bag with irrigation and aspiration.  At this point, the wounds were tested for leak, which were negative.  The anterior chamber remained deep and stable.  The patient tolerated the procedure well.  There were no operative complications, and she awoke from topical anesthesia and IV sedation without problem. No surgical specimens.  Prosthetic device used is Bausch and Lomb, posterior chamber lens, model EnVista, model number MX60, power of 25.5, serial number is [id number].         ______________________________ Susanne Greenhouse, MD    KEH/MEDQ  D:  04/17/2012  T:  04/17/2012  Job:  [id number]

## 2012-04-17 NOTE — Brief Op Note (Signed)
Pre-Op Dx: Cataract OS Post-Op Dx: Cataract OS Surgeon: Oneka Parada Anesthesia: Topical with MAC Surgery: Cataract Extraction with Intraocular lens Implant OS Implant: B&L enVista Specimen: None Complications: None 

## 2012-04-17 NOTE — Anesthesia Preprocedure Evaluation (Signed)
Anesthesia Evaluation  Patient identified by MRN, date of birth, ID band Patient awake    Reviewed: Allergy & Precautions, H&P , NPO status , Patient's Chart, lab work & pertinent test results  History of Anesthesia Complications (+) PONV  Airway Mallampati: I TM Distance: >3 FB Neck ROM: Full    Dental  (+) Teeth Intact   Pulmonary asthma ,    Pulmonary exam normal       Cardiovascular hypertension, Pt. on medications Rhythm:Regular Rate:Normal     Neuro/Psych PSYCHIATRIC DISORDERS Depression    GI/Hepatic GERD-  Medicated,(+) Hepatitis -, A  Endo/Other  diabetes, Well Controlled, Type 2, Oral Hypoglycemic AgentsMorbid obesity  Renal/GU Renal disease     Musculoskeletal  (+) Arthritis -, Osteoarthritis,    Abdominal (+) + obese,  Abdomen: soft.    Peds  Hematology negative hematology ROS (+)   Anesthesia Other Findings   Reproductive/Obstetrics                           Anesthesia Physical Anesthesia Plan  ASA: III  Anesthesia Plan: MAC   Post-op Pain Management:    Induction: Intravenous  Airway Management Planned: Nasal Cannula  Additional Equipment:   Intra-op Plan:   Post-operative Plan:   Informed Consent: I have reviewed the patients History and Physical, chart, labs and discussed the procedure including the risks, benefits and alternatives for the proposed anesthesia with the patient or authorized representative who has indicated his/her understanding and acceptance.     Plan Discussed with: CRNA  Anesthesia Plan Comments:         Anesthesia Quick Evaluation

## 2012-04-17 NOTE — Anesthesia Procedure Notes (Signed)
Procedure Name: MAC Date/Time: 04/17/2012 9:15 AM Performed by: Franco Nones Pre-anesthesia Checklist: Patient identified, Emergency Drugs available, Suction available, Timeout performed and Patient being monitored Patient Re-evaluated:Patient Re-evaluated prior to inductionOxygen Delivery Method: Nasal Cannula

## 2012-04-17 NOTE — Transfer of Care (Signed)
Immediate Anesthesia Transfer of Care Note  Patient: [name] [name]  Procedure(s) Performed: Procedure(s) (LRB): CATARACT EXTRACTION PHACO AND INTRAOCULAR LENS PLACEMENT (IOC) (Left)  Patient Location: Shortstay  Anesthesia Type: MAC  Level of Consciousness: awake  Airway & Oxygen Therapy: Patient Spontanous Breathing   Post-op Assessment: Report given to PACU RN, Post -op Vital signs reviewed and stable and Patient moving all extremities  Post vital signs: Reviewed and stable  Complications: No apparent anesthesia complications

## 2012-04-17 NOTE — Anesthesia Postprocedure Evaluation (Signed)
  Anesthesia Post-op Note  Patient: [name] [name]  Procedure(s) Performed: Procedure(s) (LRB): CATARACT EXTRACTION PHACO AND INTRAOCULAR LENS PLACEMENT (IOC) (Left)  Patient Location:  Short Stay  Anesthesia Type: MAC  Level of Consciousness: awake  Airway and Oxygen Therapy: Patient Spontanous Breathing  Post-op Pain: none  Post-op Assessment: Post-op Vital signs reviewed, Patient's Cardiovascular Status Stable, Respiratory Function Stable, Patent Airway, No signs of Nausea or vomiting and Pain level controlled  Post-op Vital Signs: Reviewed and stable  Complications: No apparent anesthesia complications

## 2012-04-18 ENCOUNTER — Encounter (HOSPITAL_COMMUNITY): Payer: Self-pay | Admitting: Ophthalmology

## 2012-04-22 ENCOUNTER — Other Ambulatory Visit: Payer: Self-pay

## 2012-06-05 ENCOUNTER — Encounter (HOSPITAL_COMMUNITY): Payer: Self-pay | Admitting: Pharmacy Technician

## 2012-06-06 ENCOUNTER — Encounter (HOSPITAL_COMMUNITY)
Admission: RE | Admit: 2012-06-06 | Discharge: 2012-06-06 | Disposition: A | Discharge status: Home / Self Care | Payer: Medicare Other | Source: Ambulatory Visit | Attending: Ophthalmology | Admitting: Ophthalmology

## 2012-06-06 ENCOUNTER — Encounter (HOSPITAL_COMMUNITY): Payer: Self-pay

## 2012-06-07 MED ORDER — NEOMYCIN-POLYMYXIN-DEXAMETH 3.5-10000-0.1 OP OINT
TOPICAL_OINTMENT | OPHTHALMIC | Status: AC
  Filled 2012-06-07: qty 3.5

## 2012-06-07 MED ORDER — CYCLOPENTOLATE-PHENYLEPHRINE 0.2-1 % OP SOLN
OPHTHALMIC | Status: AC
  Filled 2012-06-07: qty 2

## 2012-06-07 MED ORDER — LIDOCAINE HCL (PF) 1 % IJ SOLN
INTRAMUSCULAR | Status: AC
  Filled 2012-06-07: qty 2

## 2012-06-07 MED ORDER — TETRACAINE HCL 0.5 % OP SOLN
OPHTHALMIC | Status: AC
  Filled 2012-06-07: qty 2

## 2012-06-07 MED ORDER — LIDOCAINE HCL 3.5 % OP GEL
OPHTHALMIC | Status: AC
  Filled 2012-06-07: qty 5

## 2012-06-07 MED ORDER — PHENYLEPHRINE HCL 2.5 % OP SOLN
OPHTHALMIC | Status: AC
  Filled 2012-06-07: qty 2

## 2012-06-08 ENCOUNTER — Encounter (HOSPITAL_COMMUNITY): Payer: Self-pay | Admitting: *Deleted

## 2012-06-08 ENCOUNTER — Ambulatory Visit (HOSPITAL_COMMUNITY)
Admission: RE | Admit: 2012-06-08 | Discharge: 2012-06-08 | Disposition: A | Discharge status: Home / Self Care | Payer: Medicare Other | Source: Ambulatory Visit | Attending: Ophthalmology | Admitting: Ophthalmology

## 2012-06-08 ENCOUNTER — Encounter (HOSPITAL_COMMUNITY)
Admission: RE | Disposition: A | Discharge status: Home / Self Care | Payer: Self-pay | Source: Ambulatory Visit | Attending: Ophthalmology

## 2012-06-08 ENCOUNTER — Ambulatory Visit (HOSPITAL_COMMUNITY): Payer: Medicare Other | Admitting: Anesthesiology

## 2012-06-08 ENCOUNTER — Encounter (HOSPITAL_COMMUNITY): Payer: Self-pay | Admitting: Anesthesiology

## 2012-06-08 DIAGNOSIS — J45909 Unspecified asthma, uncomplicated: Secondary | ICD-10-CM | POA: Insufficient documentation

## 2012-06-08 DIAGNOSIS — Z883 Allergy status to other anti-infective agents status: Secondary | ICD-10-CM | POA: Insufficient documentation

## 2012-06-08 DIAGNOSIS — I10 Essential (primary) hypertension: Secondary | ICD-10-CM | POA: Insufficient documentation

## 2012-06-08 DIAGNOSIS — E119 Type 2 diabetes mellitus without complications: Secondary | ICD-10-CM | POA: Insufficient documentation

## 2012-06-08 DIAGNOSIS — H2510 Age-related nuclear cataract, unspecified eye: Secondary | ICD-10-CM | POA: Insufficient documentation

## 2012-06-08 DIAGNOSIS — M109 Gout, unspecified: Secondary | ICD-10-CM | POA: Insufficient documentation

## 2012-06-08 DIAGNOSIS — Z79899 Other long term (current) drug therapy: Secondary | ICD-10-CM | POA: Insufficient documentation

## 2012-06-08 DIAGNOSIS — E7800 Pure hypercholesterolemia, unspecified: Secondary | ICD-10-CM | POA: Insufficient documentation

## 2012-06-08 DIAGNOSIS — Z7982 Long term (current) use of aspirin: Secondary | ICD-10-CM | POA: Insufficient documentation

## 2012-06-08 HISTORY — PX: CATARACT EXTRACTION W/PHACO: SHX586

## 2012-06-08 SURGERY — PHACOEMULSIFICATION, CATARACT, WITH IOL INSERTION
Anesthesia: Monitor Anesthesia Care | Site: Eye | Laterality: Right | Wound class: Clean

## 2012-06-08 MED ORDER — MIDAZOLAM HCL 2 MG/2ML IJ SOLN
INTRAMUSCULAR | Status: AC
  Filled 2012-06-08: qty 2

## 2012-06-08 MED ORDER — FENTANYL CITRATE 0.05 MG/ML IJ SOLN: 25.0000 ug | INTRAMUSCULAR | Status: DC | PRN

## 2012-06-08 MED ORDER — EPINEPHRINE HCL 1 MG/ML IJ SOLN
INTRAMUSCULAR | Status: AC
  Filled 2012-06-08: qty 1

## 2012-06-08 MED ORDER — ONDANSETRON HCL 4 MG/2ML IJ SOLN: 4.0000 mg | Freq: Once | INTRAMUSCULAR | Status: DC | PRN

## 2012-06-08 MED ORDER — PHENYLEPHRINE HCL 2.5 % OP SOLN
1.0000 [drp] | OPHTHALMIC | Status: AC
  Administered 2012-06-08 (×3): 1 [drp] via OPHTHALMIC

## 2012-06-08 MED ORDER — LIDOCAINE 3.5 % OP GEL OPTIME - NO CHARGE
OPHTHALMIC | Status: DC | PRN
  Administered 2012-06-08: 2 [drp] via OPHTHALMIC

## 2012-06-08 MED ORDER — NEOMYCIN-POLYMYXIN-DEXAMETH 0.1 % OP OINT
TOPICAL_OINTMENT | OPHTHALMIC | Status: DC | PRN
  Administered 2012-06-08: 1 via OPHTHALMIC

## 2012-06-08 MED ORDER — CYCLOPENTOLATE-PHENYLEPHRINE 0.2-1 % OP SOLN
1.0000 [drp] | OPHTHALMIC | Status: AC
  Administered 2012-06-08 (×3): 1 [drp] via OPHTHALMIC

## 2012-06-08 MED ORDER — MIDAZOLAM HCL 2 MG/2ML IJ SOLN
1.0000 mg | INTRAMUSCULAR | Status: DC | PRN
  Administered 2012-06-08: 2 mg via INTRAVENOUS

## 2012-06-08 MED ORDER — LACTATED RINGERS IV SOLN
INTRAVENOUS | Status: DC
  Administered 2012-06-08: 09:00:00 via INTRAVENOUS

## 2012-06-08 MED ORDER — LIDOCAINE HCL (PF) 1 % IJ SOLN
INTRAOCULAR | Status: DC | PRN
  Administered 2012-06-08: 10:00:00 via OPHTHALMIC

## 2012-06-08 MED ORDER — PROVISC 10 MG/ML IO SOLN
INTRAOCULAR | Status: DC | PRN
  Administered 2012-06-08: 8.5 mg via INTRAOCULAR

## 2012-06-08 MED ORDER — BSS IO SOLN
INTRAOCULAR | Status: DC | PRN
  Administered 2012-06-08: 15 mL via INTRAOCULAR

## 2012-06-08 MED ORDER — POVIDONE-IODINE 5 % OP SOLN
OPHTHALMIC | Status: DC | PRN
  Administered 2012-06-08: 1 via OPHTHALMIC

## 2012-06-08 MED ORDER — LIDOCAINE HCL 3.5 % OP GEL
1.0000 "application " | Freq: Once | OPHTHALMIC | Status: AC
  Administered 2012-06-08: 1 via OPHTHALMIC

## 2012-06-08 MED ORDER — TETRACAINE HCL 0.5 % OP SOLN
1.0000 [drp] | OPHTHALMIC | Status: AC
  Administered 2012-06-08 (×3): 1 [drp] via OPHTHALMIC

## 2012-06-08 MED ORDER — EPINEPHRINE HCL 1 MG/ML IJ SOLN
INTRAOCULAR | Status: DC | PRN
  Administered 2012-06-08: 10:00:00

## 2012-06-08 SURGICAL SUPPLY — 32 items

## 2012-06-08 NOTE — Anesthesia Preprocedure Evaluation (Signed)
Anesthesia Evaluation  Patient identified by MRN, date of birth, ID band Patient awake    Reviewed: Allergy & Precautions, H&P , NPO status , Patient's Chart, lab work & pertinent test results  History of Anesthesia Complications (+) PONV  Airway Mallampati: I TM Distance: >3 FB Neck ROM: Full    Dental  (+) Teeth Intact   Pulmonary asthma ,    Pulmonary exam normal       Cardiovascular hypertension, Pt. on medications Rhythm:Regular Rate:Normal     Neuro/Psych PSYCHIATRIC DISORDERS Depression    GI/Hepatic GERD-  Medicated,(+) Hepatitis -, A  Endo/Other  diabetes, Well Controlled, Type 2, Oral Hypoglycemic AgentsMorbid obesity  Renal/GU Renal disease     Musculoskeletal  (+) Arthritis -, Osteoarthritis,    Abdominal (+) + obese,  Abdomen: soft.    Peds  Hematology negative hematology ROS (+)   Anesthesia Other Findings   Reproductive/Obstetrics                           Anesthesia Physical Anesthesia Plan  ASA: III  Anesthesia Plan: MAC   Post-op Pain Management:    Induction: Intravenous  Airway Management Planned: Nasal Cannula  Additional Equipment:   Intra-op Plan:   Post-operative Plan:   Informed Consent: I have reviewed the patients History and Physical, chart, labs and discussed the procedure including the risks, benefits and alternatives for the proposed anesthesia with the patient or authorized representative who has indicated his/her understanding and acceptance.     Plan Discussed with:   Anesthesia Plan Comments:         Anesthesia Quick Evaluation

## 2012-06-08 NOTE — Preoperative (Signed)
Beta Blockers   Reason not to administer Beta Blockers:Not Applicable 

## 2012-06-08 NOTE — Op Note (Signed)
Date of Admission: 06/08/12  Date of Surgery: 06/08/12  Pre-Op Dx: Cataract  Right  Eye  Post-Op Dx: Nuclear Cataract Right  Eye, Dx Code 366.16  Surgeon: Gemma Payor, M.D.  Assistants: None  Anesthesia: Topical with MAC  Indications: Painless, progressive loss of vision with compromise of daily activities.  Surgery: Cataract Extraction with Intraocular lens Implant Right Eye  Discription: The patient had dilating drops and viscous lidocaine placed into the left eye in the pre-op holding area. After transfer to the operating room, a time out was performed. The patient was then prepped and draped. Beginning with a 75 degree blade a paracentesis port was made at the surgeon's 2 o'clock position. The anterior chamber was then filled with 2% non-preserved lidocaine. This was followed by filling the anterior chamber with Provisc. A bent cystatome needle was used to create a continuous tear capsulotomy. Hydrodissection was performed with balanced salt solution on a Fine canula. The lens nucleus was then removed using the phacoemulsification handpiece. Residual cortex was removed with the I&A handpiece. The anterior chamber and capsular bag were refilled with Provisc. A posterior chamber intraocular lens was placed into the capsular bag with it's injector. The implant was positioned with the Kuglan hook. The Provisc was then removed from the anterior chamber and capsular bag with the I&A handpiece. Stromal hydration of the main incision and paracentesis port was performed with BSS on a Fine canula. The wounds were tested for leak which was negative. The patient tolerated the procedure well. There were no operative complications. The patient was then transferred to the recovery room in stable condition.  Prosthetic device: B&L enVista, MX60, power 26.0 ,#[id number].  Specimen: None  EBL: None  Complications: None

## 2012-06-08 NOTE — Anesthesia Procedure Notes (Signed)
Procedure Name: MAC Performed by: Glendora Score A Pre-anesthesia Checklist: Patient identified, Emergency Drugs available, Suction available and Patient being monitored Oxygen Delivery Method: Nasal cannula Placement Confirmation: positive ETCO2

## 2012-06-08 NOTE — H&P (Signed)
I have reviewed the H&P, the patient was re-examined, and I have identified no interval changes in medical condition and plan of care since the history and physical of record  

## 2012-06-08 NOTE — Transfer of Care (Signed)
Immediate Anesthesia Transfer of Care Note  Patient: [name] [name]  Procedure(s) Performed: Procedure(s) with comments: CATARACT EXTRACTION PHACO AND INTRAOCULAR LENS PLACEMENT (IOC) (Right) - CDE 5.84  Patient Location: PACU  Anesthesia Type:MAC  Level of Consciousness: awake, alert  and oriented  Airway & Oxygen Therapy: Patient Spontanous Breathing and Patient connected to nasal cannula oxygen  Post-op Assessment: Report given to PACU RN and Post -op Vital signs reviewed and stable  Post vital signs: Reviewed and stable  Complications: No apparent anesthesia complications

## 2012-06-08 NOTE — Anesthesia Postprocedure Evaluation (Signed)
  Anesthesia Post-op Note  Patient: [name] [name]  Procedure(s) Performed: Procedure(s) with comments: CATARACT EXTRACTION PHACO AND INTRAOCULAR LENS PLACEMENT (IOC) (Right) - CDE 5.84  Patient Location: PACU  Anesthesia Type:MAC  Level of Consciousness: awake, alert  and oriented  Airway and Oxygen Therapy: Patient Spontanous Breathing and Patient connected to nasal cannula oxygen  Post-op Pain: none  Post-op Assessment: Post-op Vital signs reviewed, Patient's Cardiovascular Status Stable, Respiratory Function Stable, Patent Airway and No signs of Nausea or vomiting  Post-op Vital Signs: Reviewed and stable  Complications: No apparent anesthesia complications

## 2012-06-12 ENCOUNTER — Encounter (HOSPITAL_COMMUNITY): Payer: Self-pay | Admitting: Ophthalmology

## 2013-01-11 ENCOUNTER — Other Ambulatory Visit: Payer: Self-pay

## 2014-02-28 ENCOUNTER — Encounter (HOSPITAL_COMMUNITY): Payer: Medicare Other

## 2014-03-04 ENCOUNTER — Other Ambulatory Visit (HOSPITAL_COMMUNITY): Payer: Self-pay | Admitting: Orthopaedic Surgery

## 2014-03-04 ENCOUNTER — Ambulatory Visit (HOSPITAL_COMMUNITY)
Admission: RE | Admit: 2014-03-04 | Discharge: 2014-03-04 | Disposition: A | Discharge status: Home / Self Care | Payer: Medicare Other | Source: Ambulatory Visit | Attending: Vascular Surgery | Admitting: Vascular Surgery

## 2014-03-04 DIAGNOSIS — I10 Essential (primary) hypertension: Secondary | ICD-10-CM | POA: Insufficient documentation

## 2014-03-04 DIAGNOSIS — M79606 Pain in leg, unspecified: Secondary | ICD-10-CM | POA: Insufficient documentation

## 2014-03-04 DIAGNOSIS — E119 Type 2 diabetes mellitus without complications: Secondary | ICD-10-CM | POA: Diagnosis not present

## 2014-03-15 ENCOUNTER — Other Ambulatory Visit: Payer: Self-pay | Admitting: Orthopaedic Surgery

## 2014-03-15 DIAGNOSIS — M545 Low back pain: Secondary | ICD-10-CM

## 2014-03-26 ENCOUNTER — Ambulatory Visit
Admission: RE | Admit: 2014-03-26 | Discharge: 2014-03-26 | Disposition: A | Discharge status: Home / Self Care | Payer: Medicare Other | Source: Ambulatory Visit | Attending: Orthopaedic Surgery | Admitting: Orthopaedic Surgery

## 2014-03-26 DIAGNOSIS — M545 Low back pain: Secondary | ICD-10-CM

## 2014-03-26 MED ORDER — GADOBENATE DIMEGLUMINE 529 MG/ML IV SOLN
20.0000 mL | Freq: Once | INTRAVENOUS | Status: AC | PRN
  Administered 2014-03-26: 20 mL via INTRAVENOUS

## 2014-04-05 ENCOUNTER — Other Ambulatory Visit (HOSPITAL_COMMUNITY): Payer: Self-pay | Admitting: Orthopaedic Surgery

## 2014-04-19 ENCOUNTER — Encounter (HOSPITAL_COMMUNITY)
Admission: RE | Admit: 2014-04-19 | Discharge: 2014-04-19 | Disposition: A | Discharge status: Home / Self Care | Payer: Medicare Other | Source: Ambulatory Visit | Attending: Orthopaedic Surgery | Admitting: Orthopaedic Surgery

## 2014-04-19 ENCOUNTER — Encounter (HOSPITAL_COMMUNITY): Payer: Self-pay

## 2014-04-19 ENCOUNTER — Ambulatory Visit (HOSPITAL_COMMUNITY)
Admission: RE | Admit: 2014-04-19 | Discharge: 2014-04-19 | Disposition: A | Discharge status: Home / Self Care | Payer: Medicare Other | Source: Ambulatory Visit | Attending: Orthopaedic Surgery | Admitting: Orthopaedic Surgery

## 2014-04-19 DIAGNOSIS — Z01810 Encounter for preprocedural cardiovascular examination: Secondary | ICD-10-CM | POA: Insufficient documentation

## 2014-04-19 DIAGNOSIS — M4806 Spinal stenosis, lumbar region: Secondary | ICD-10-CM | POA: Insufficient documentation

## 2014-04-19 DIAGNOSIS — Z01818 Encounter for other preprocedural examination: Secondary | ICD-10-CM | POA: Diagnosis present

## 2014-04-19 DIAGNOSIS — Z01812 Encounter for preprocedural laboratory examination: Secondary | ICD-10-CM | POA: Insufficient documentation

## 2014-04-19 DIAGNOSIS — E119 Type 2 diabetes mellitus without complications: Secondary | ICD-10-CM | POA: Diagnosis not present

## 2014-04-19 DIAGNOSIS — M48061 Spinal stenosis, lumbar region without neurogenic claudication: Secondary | ICD-10-CM

## 2014-04-19 DIAGNOSIS — Z87891 Personal history of nicotine dependence: Secondary | ICD-10-CM | POA: Diagnosis not present

## 2014-04-19 DIAGNOSIS — J45909 Unspecified asthma, uncomplicated: Secondary | ICD-10-CM | POA: Diagnosis not present

## 2014-04-19 HISTORY — DX: Reserved for inherently not codable concepts without codable children: IMO0001

## 2014-04-19 LAB — CBC
HCT: 39.9 % (ref 36.0–46.0)
Hemoglobin: 12.8 g/dL (ref 12.0–15.0)
MCH: 30.3 pg (ref 26.0–34.0)
MCHC: 32.1 g/dL (ref 30.0–36.0)
MCV: 94.3 fL (ref 78.0–100.0)
Platelets: 348 10*3/uL (ref 150–400)
RBC: 4.23 MIL/uL (ref 3.87–5.11)
RDW: 13.9 % (ref 11.5–15.5)
WBC: 8.5 10*3/uL (ref 4.0–10.5)

## 2014-04-19 LAB — COMPREHENSIVE METABOLIC PANEL
ALT: 23 U/L (ref 0–35)
AST: 22 U/L (ref 0–37)
Albumin: 3.5 g/dL (ref 3.5–5.2)
Alkaline Phosphatase: 95 U/L (ref 39–117)
Anion gap: 11 (ref 5–15)
BILIRUBIN TOTAL: 0.3 mg/dL (ref 0.3–1.2)
BUN: 45 mg/dL — ABNORMAL HIGH (ref 6–23)
CHLORIDE: 101 mmol/L (ref 96–112)
CO2: 27 mmol/L (ref 19–32)
Calcium: 9.3 mg/dL (ref 8.4–10.5)
Creatinine, Ser: 1.69 mg/dL — ABNORMAL HIGH (ref 0.50–1.10)
GFR, EST AFRICAN AMERICAN: 37 mL/min — AB (ref >90)
GFR, EST NON AFRICAN AMERICAN: 32 mL/min — AB (ref >90)
Glucose, Bld: 133 mg/dL — ABNORMAL HIGH (ref 70–99)
POTASSIUM: 4.8 mmol/L (ref 3.5–5.1)
Sodium: 139 mmol/L (ref 135–145)
Total Protein: 7.2 g/dL (ref 6.0–8.3)

## 2014-04-19 LAB — SURGICAL PCR SCREEN
MRSA, PCR: NEGATIVE
Staphylococcus aureus: POSITIVE — AB

## 2014-04-19 LAB — PROTIME-INR
INR: 0.99 (ref 0.00–1.49)
Prothrombin Time: 13.2 seconds (ref 11.6–15.2)

## 2014-04-19 NOTE — Progress Notes (Signed)
Rx. For Mupirocin called to [facility]  [place]

## 2014-04-19 NOTE — Pre-Procedure Instructions (Signed)
[name] [name]  04/19/2014   Your procedure is scheduled on:  04/20/2014  Friday   [facility].   Call this number if you have problems the morning of surgery: [phone]   Remember:   Do not eat food or drink liquids after midnight.    Take these medicines the morning of surgery with A SIP OF WATER: inhaler as needed,allopurinol,gabapentin(neurontin),venlafaxine(effexor),              No diabetes medications the morning of surgery   Do not wear jewelry, make-up or nail polish.  Do not wear lotions, powders, or perfumes. You may not wear deodorant.  Do not shave 48 hours prior to surgery..  Do not bring valuables to the hospital.  [facility] is not [facility]  for any belongings or valuables.               Contacts, dentures or bridgework may not be worn into surgery.   Leave suitcase in the car. After surgery it may be brought to your room.  For patients admitted to the hospital, discharge time is determined by your  treatment team.               Patients discharged the day of surgery will not be allowed to drive home.      Special Instructions: See attached sheet for instructions on CHG shower/bath    Please read over the following fact sheets that you were given: Pain Booklet and Surgical Site Infection Prevention

## 2014-04-19 NOTE — Progress Notes (Signed)
Anesthesia Chart Review: Patient is a 59 year old female scheduled for L3-4 decompression on December 11, 2014 by Dr. Ophelia CharterYates.     History includes former smoker, DM2, HTN, CKD, gout, asthma, exertional dyspnea, depression, GERD, hepatitis A, bilateral TKR '13, cataract extractions '14, post-operative N/V. PCP is listed as Dr. Samuel Jesterynthia Butler [phone]).   Meds include albuterol, ASA, allopurinal, Zyrtec, Voltaren, gabapentin, glipizide, Liraglutide, lisinopril-HCTZ, MVI, Aleve, red yeast rice, Effexor, XR.   EKG on 04/19/14 showed NSR. Isolated negative T wave in aVL.    [facility] August of 2010. A stress echo and [facility]D [facility] (copies found under Media tab, Conversion Documents 10/25/08). See below for results. No further cardiac work-up was recommended at that time.  2D echo on 11/04/08 showed mild LVH, LV wall motion and contractility WNL, EF 60-65%, abnormal LV diastolic filling consistent with impaired relaxation, trace MR, trace TR, RV systolic pressure 22 mmHg.  Stress echo on 11/04/08 was negative for ischemia, EF > 65%.  CXR on 04/19/14: There is no active cardiopulmonary disease.  Labs reviewed. K+ 4.8, BUN 45, Cr 1.69, glucose 133. CBC WNL. PT/INR WNL.  She's has had variable Cr readings since 2012 ranging anywhere from normal (11/2011), ~ 1.5 (12/2010), and up to 2.26 (08/2011). I called her PCP office.  Patient had labs done there on 03/12/14 with BUN/Cr 46/1.60.  Overall, I think her renal function appears stable when compared to her most recent labs.    I no acute changes then I anticipate that she can proceed as planned.  [name] Ochsllison Orianna Biskup, PA-C [facility]/Anesthesiology Phone [phone] 04/19/2014 4:33 PM

## 2014-04-19 NOTE — Progress Notes (Signed)
Pt. States that her PCP had scheduled a sleep study for her but it was cost prohibitive ,so she did not have it done.

## 2014-05-02 MED ORDER — CEFAZOLIN SODIUM-DEXTROSE 2-3 GM-% IV SOLR
2.0000 g | INTRAVENOUS | Status: AC
  Administered 2014-05-03: 2 g via INTRAVENOUS
  Filled 2014-05-02: qty 50

## 2014-05-02 MED ORDER — CHLORHEXIDINE GLUCONATE 4 % EX LIQD
60.0000 mL | Freq: Once | CUTANEOUS | Status: DC
  Filled 2014-05-02: qty 60

## 2014-05-02 NOTE — H&P (Deleted)
   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  She is not able to stand long.  Cannot walk far.  Does better when she leans over a grocery cart.  Increased pain with extension, better with flexion.  Previous decompression L4-5 in 2012.  She did have problems with a seroma postoperatively.  No evidence of CSF leak.  She had multiple aspirations, negative for CSF fluid.  Eventually the seroma resolved.  She does have increased BMI at 5 feet tall, 250 pounds.  Normally followed by Dr. Samuel Jesterynthia Butler.     CURRENT MEDICATIONS:   She is on lisinopril/hydrochlorothiazide 20/25 1 p.o. daily, diclofenac 75 mg b.i.d., Effexor 150 mg daily, glipizide for diabetes 5 mg daily, Victoza 1-8 injections, Zyrtec 10 mg, gabapentin 100 mg 4 a day, vitamin D 1 a week, allopurinol 100 mg daily and hydrocodone 5/325 p.r.n.     ALLERGIES:  Tetracycline.   PAST SURGICAL HISTORY:   Include bilateral total knee arthroplasties in 2013 by Dr. Eulah PontMurphy.  Other surgeries include carpal tunnel in 1992, C-section 1979.   PAST MEDICAL HISTORY:   She does have some peripheral neuropathy  from diabetes.   FAMILY HISTORY:   Positive for breast, kidney and ovarian cancer.  Positive for asthma, heart disease, diabetes and hypertension.   SOCIAL HISTORY:   The patient is divorced, disabled.  Does not smoke or drink.   REVIEW OF SYSTEMS:   Positive for acid reflux, arthritis, asthma, bronchitis, cataracts, depression, diabetes, hepatitis and hypertension.     PHYSICAL EXAMINATION:  The patient is 5 feet tall, 250 pounds.  Extraocular movements intact.  Lungs are clear.  She does not have any wheezing today.  Heart:  Regular rate and rhythm.  Liver and spleen are not palpable due to body habitus.  She has normal bowel sounds.  Hip range of motion is full.  Anterior tib, EHL is intact.   Distal pulses are intact.     RADIOGRAPHS/TEST:   MRI 03/27/2014 shows increased circumferential disk bulge which has progressed.  Moderate facet ligamentous hypertrophy, right greater than left lateral recess stenosis, moderate-to-severe central stenosis.  Previous decompression at 4-5.  No evidence of abnormal fluid collection.  Mild postop granulation tissue.  5-1 looks good.     PLAN:  Decompression L3-4.  Procedure discussed.  She understands and request to proceed.  Risk of surgery discussed including bleeding, infection.  She will likely may have some more seroma due to her body habitus.  All questions answered.  She understands and request to proceed.

## 2014-05-02 NOTE — H&P (Signed)
[name] FredericksonJoyce H [name] [name] is an 59 y.o. female.    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  She is not able to stand long.  Cannot walk far.  Does better when she leans over a grocery cart.  Increased pain with extension, better with flexion.  Previous decompression L4-5 in 2012.  She did have problems with a seroma postoperatively.  No evidence of CSF leak.  She had multiple aspirations, negative for CSF fluid.  Eventually the seroma resolved.  She does have increased BMI at 5 feet tall, 250 pounds.  Normally followed by Dr. Samuel Jesterynthia Butler.     CURRENT MEDICATIONS:   She is on lisinopril/hydrochlorothiazide 20/25 1 p.o. daily, diclofenac 75 mg b.i.d., Effexor 150 mg daily, glipizide for diabetes 5 mg daily, Victoza 1-8 injections, Zyrtec 10 mg, gabapentin 100 mg 4 a day, vitamin D 1 a week, allopurinol 100 mg daily and hydrocodone 5/325 p.r.n.     ALLERGIES:  Tetracycline.   PAST SURGICAL HISTORY:   Include bilateral total knee arthroplasties in 2013 by Dr. Eulah PontMurphy.  Other surgeries include carpal tunnel in 1992, C-section 1979.   PAST MEDICAL HISTORY:   She does have some peripheral neuropathy  from diabetes.   FAMILY HISTORY:   Positive for breast, kidney and ovarian cancer.  Positive for asthma, heart disease, diabetes and hypertension.   SOCIAL HISTORY:   The patient is divorced, disabled.  Does not smoke or drink.   REVIEW OF SYSTEMS:   Positive for acid reflux, arthritis, asthma, bronchitis, cataracts, depression, diabetes, hepatitis and hypertension.        Past Medical History  Diagnosis Date  . Gout   . Hypertension   . Diabetes mellitus   . Asthma   . Complication of anesthesia   . PONV (postoperative nausea and vomiting)   . Depression   . Bronchitis     hx of  . Renal disorder     "medication induced"  . GERD (gastroesophageal reflux disease)   . Benign skin lesion     removed by dermatologist  .  Arthritis   . Chronic low back pain     hx of  . Cataract     left  . Shortness of breath dyspnea     with exertion  . Hepatitis     hep A    Past Surgical History  Procedure Laterality Date  . Cesarean section    . Total knee arthroplasty  08/18/2011    Procedure: TOTAL KNEE ARTHROPLASTY;  Surgeon: Loreta Aveaniel F Murphy, MD;  [facility];  Service: Orthopedics;  Laterality: Right;  right total knee arthroplasty  . Carpal tunnel release      bilaterally  . Back surgery      "stenosis"  . Knee arthroscopy      right  . Total knee arthroplasty  12/01/2011    Procedure: TOTAL KNEE ARTHROPLASTY;  Surgeon: Loreta Aveaniel F Murphy, MD;  [facility];  Service: Orthopedics;  Laterality: Left;  left total knee arthroplasty  . Cataract extraction w/phaco Left 04/17/2012    Procedure: CATARACT EXTRACTION PHACO AND INTRAOCULAR LENS PLACEMENT (IOC);  Surgeon: Gemma PayorKerry Hunt, MD;  Location: [facility] ORS;  Service: Ophthalmology;  Laterality: Left;  CDE:  12.78  . Cataract extraction w/phaco Right 06/08/2012    Procedure: CATARACT EXTRACTION PHACO AND INTRAOCULAR LENS PLACEMENT (IOC);  Surgeon: Gemma PayorKerry Hunt, MD;  Location: [facility] ORS;  Service: Ophthalmology;  Laterality: Right;  CDE 5.84  No family history on file. Social History:  reports that she quit smoking about 28 years ago. Her smoking use included Cigarettes. She has a 3 pack-year smoking history. She does not have any smokeless tobacco history on file. She reports that she drinks alcohol. She reports that she does not use illicit drugs.  Allergies:  Allergies  Allergen Reactions  . Statins Other (See Comments)    Leg pain  . Tetracycline Other (See Comments)    Hepatitis    No prescriptions prior to admission    No results found for this or any previous visit (from the past 48 hour(s)). No results found.  ROS  There were no vitals taken for this visit. Physical Exam    PHYSICAL EXAMINATION:  The patient is 5 feet tall, 250 pounds.  Extraocular  movements intact.  Lungs are clear.  She does not have any wheezing today.  Heart:  Regular rate and rhythm.  Liver and spleen are not palpable due to body habitus.  She has normal bowel sounds.  Hip range of motion is full.  Anterior tib, EHL is intact.  Distal pulses are intact.     RADIOGRAPHS/TEST:   MRI 03/27/2014 shows increased circumferential disk bulge which has progressed.  Moderate facet ligamentous hypertrophy, right greater than left lateral recess stenosis, moderate-to-severe central stenosis.  Previous decompression at 4-5.  No evidence of abnormal fluid collection.  Mild postop granulation tissue.  5-1 looks good.     Assessment:  L3-4 stenosis  PLAN:  Decompression L3-4.  Procedure discussed.  She understands and request to proceed.  Risk of surgery discussed including bleeding, infection.  She will likely may have some more seroma due to her body habitus.  All questions answered.  She understands and request to proceed.    [name] 05/02/2014, 3:41 PM

## 2014-05-03 ENCOUNTER — Ambulatory Visit (HOSPITAL_COMMUNITY): Payer: Medicare Other | Admitting: Anesthesiology

## 2014-05-03 ENCOUNTER — Observation Stay (HOSPITAL_COMMUNITY)
Admission: RE | Admit: 2014-05-03 | Discharge: 2014-05-07 | Disposition: E | Payer: Medicare Other | Source: Ambulatory Visit | Attending: Orthopaedic Surgery | Admitting: Orthopaedic Surgery

## 2014-05-03 ENCOUNTER — Encounter (HOSPITAL_COMMUNITY): Admission: RE | Disposition: E | Payer: Medicare Other | Source: Ambulatory Visit | Attending: Orthopaedic Surgery

## 2014-05-03 ENCOUNTER — Ambulatory Visit (HOSPITAL_COMMUNITY): Payer: Medicare Other | Admitting: Vascular Surgery

## 2014-05-03 ENCOUNTER — Ambulatory Visit (HOSPITAL_COMMUNITY): Payer: Medicare Other

## 2014-05-03 ENCOUNTER — Encounter (HOSPITAL_COMMUNITY): Payer: Self-pay | Admitting: *Deleted

## 2014-05-03 DIAGNOSIS — J449 Chronic obstructive pulmonary disease, unspecified: Secondary | ICD-10-CM | POA: Insufficient documentation

## 2014-05-03 DIAGNOSIS — Z87448 Personal history of other diseases of urinary system: Secondary | ICD-10-CM | POA: Insufficient documentation

## 2014-05-03 DIAGNOSIS — M4806 Spinal stenosis, lumbar region: Secondary | ICD-10-CM | POA: Diagnosis not present

## 2014-05-03 DIAGNOSIS — Z96653 Presence of artificial knee joint, bilateral: Secondary | ICD-10-CM | POA: Insufficient documentation

## 2014-05-03 DIAGNOSIS — Z961 Presence of intraocular lens: Secondary | ICD-10-CM | POA: Insufficient documentation

## 2014-05-03 DIAGNOSIS — Z888 Allergy status to other drugs, medicaments and biological substances status: Secondary | ICD-10-CM | POA: Diagnosis not present

## 2014-05-03 DIAGNOSIS — E1142 Type 2 diabetes mellitus with diabetic polyneuropathy: Secondary | ICD-10-CM | POA: Diagnosis not present

## 2014-05-03 DIAGNOSIS — Z87891 Personal history of nicotine dependence: Secondary | ICD-10-CM | POA: Insufficient documentation

## 2014-05-03 DIAGNOSIS — B159 Hepatitis A without hepatic coma: Secondary | ICD-10-CM | POA: Insufficient documentation

## 2014-05-03 DIAGNOSIS — M1990 Unspecified osteoarthritis, unspecified site: Secondary | ICD-10-CM | POA: Insufficient documentation

## 2014-05-03 DIAGNOSIS — F329 Major depressive disorder, single episode, unspecified: Secondary | ICD-10-CM | POA: Insufficient documentation

## 2014-05-03 DIAGNOSIS — I10 Essential (primary) hypertension: Secondary | ICD-10-CM | POA: Diagnosis not present

## 2014-05-03 DIAGNOSIS — Z9842 Cataract extraction status, left eye: Secondary | ICD-10-CM | POA: Diagnosis not present

## 2014-05-03 DIAGNOSIS — Z419 Encounter for procedure for purposes other than remedying health state, unspecified: Secondary | ICD-10-CM

## 2014-05-03 DIAGNOSIS — J45909 Unspecified asthma, uncomplicated: Secondary | ICD-10-CM | POA: Diagnosis not present

## 2014-05-03 DIAGNOSIS — M109 Gout, unspecified: Secondary | ICD-10-CM | POA: Insufficient documentation

## 2014-05-03 DIAGNOSIS — Z6841 Body Mass Index (BMI) 40.0 and over, adult: Secondary | ICD-10-CM | POA: Diagnosis not present

## 2014-05-03 DIAGNOSIS — M5126 Other intervertebral disc displacement, lumbar region: Secondary | ICD-10-CM | POA: Diagnosis present

## 2014-05-03 DIAGNOSIS — Z881 Allergy status to other antibiotic agents status: Secondary | ICD-10-CM | POA: Insufficient documentation

## 2014-05-03 DIAGNOSIS — K219 Gastro-esophageal reflux disease without esophagitis: Secondary | ICD-10-CM | POA: Insufficient documentation

## 2014-05-03 DIAGNOSIS — F1099 Alcohol use, unspecified with unspecified alcohol-induced disorder: Secondary | ICD-10-CM | POA: Insufficient documentation

## 2014-05-03 DIAGNOSIS — Z9889 Other specified postprocedural states: Secondary | ICD-10-CM

## 2014-05-03 HISTORY — PX: LUMBAR LAMINECTOMY/DECOMPRESSION MICRODISCECTOMY: SHX5026

## 2014-05-03 LAB — GLUCOSE, CAPILLARY
GLUCOSE-CAPILLARY: 209 mg/dL — AB (ref 70–99)
Glucose-Capillary: 227 mg/dL — ABNORMAL HIGH (ref 70–99)
Glucose-Capillary: 209 mg/dL — ABNORMAL HIGH (ref 70–99)
Glucose-Capillary: 219 mg/dL — ABNORMAL HIGH (ref 70–99)

## 2014-05-03 SURGERY — LUMBAR LAMINECTOMY/DECOMPRESSION MICRODISCECTOMY
Anesthesia: General

## 2014-05-03 MED ORDER — LISINOPRIL-HYDROCHLOROTHIAZIDE 20-25 MG PO TABS: 1.0000 | ORAL_TABLET | Freq: Every day | ORAL | Status: DC

## 2014-05-03 MED ORDER — VENLAFAXINE HCL ER 75 MG PO CP24
75.0000 mg | ORAL_CAPSULE | Freq: Every day | ORAL | Status: DC
  Administered 2014-05-04: 75 mg via ORAL
  Filled 2014-05-03: qty 1

## 2014-05-03 MED ORDER — SODIUM CHLORIDE 0.9 % IJ SOLN
INTRAMUSCULAR | Status: AC
  Filled 2014-05-03: qty 20

## 2014-05-03 MED ORDER — LIDOCAINE HCL (CARDIAC) 20 MG/ML IV SOLN
INTRAVENOUS | Status: DC | PRN
  Administered 2014-05-03: 80 mg via INTRAVENOUS

## 2014-05-03 MED ORDER — ONDANSETRON HCL 4 MG/2ML IJ SOLN
4.0000 mg | INTRAMUSCULAR | Status: DC | PRN
  Administered 2014-05-04 (×2): 4 mg via INTRAVENOUS
  Filled 2014-05-03 (×2): qty 2

## 2014-05-03 MED ORDER — GLYCOPYRROLATE 0.2 MG/ML IJ SOLN
INTRAMUSCULAR | Status: DC | PRN
  Administered 2014-05-03: .8 mg via INTRAVENOUS

## 2014-05-03 MED ORDER — ALBUTEROL SULFATE (2.5 MG/3ML) 0.083% IN NEBU: 2.5000 mg | INHALATION_SOLUTION | Freq: Four times a day (QID) | RESPIRATORY_TRACT | Status: DC | PRN

## 2014-05-03 MED ORDER — POTASSIUM CHLORIDE IN NACL 20-0.45 MEQ/L-% IV SOLN
INTRAVENOUS | Status: DC
  Administered 2014-05-03 – 2014-05-04 (×2): via INTRAVENOUS
  Filled 2014-05-03 (×5): qty 1000

## 2014-05-03 MED ORDER — OXYCODONE-ACETAMINOPHEN 5-325 MG PO TABS
ORAL_TABLET | ORAL | Status: AC
  Filled 2014-05-03: qty 1

## 2014-05-03 MED ORDER — 0.9 % SODIUM CHLORIDE (POUR BTL) OPTIME
TOPICAL | Status: DC | PRN
  Administered 2014-05-03: 1000 mL

## 2014-05-03 MED ORDER — FENTANYL CITRATE 0.05 MG/ML IJ SOLN
INTRAMUSCULAR | Status: DC | PRN
  Administered 2014-05-03: 50 ug via INTRAVENOUS

## 2014-05-03 MED ORDER — HEMOSTATIC AGENTS (NO CHARGE) OPTIME
TOPICAL | Status: DC | PRN
  Administered 2014-05-03: 1 via TOPICAL

## 2014-05-03 MED ORDER — EPHEDRINE SULFATE 50 MG/ML IJ SOLN
INTRAMUSCULAR | Status: DC | PRN
  Administered 2014-05-03 (×3): 10 mg via INTRAVENOUS

## 2014-05-03 MED ORDER — BUPIVACAINE HCL (PF) 0.25 % IJ SOLN
INTRAMUSCULAR | Status: AC
  Filled 2014-05-03: qty 30

## 2014-05-03 MED ORDER — GLIPIZIDE ER 5 MG PO TB24
5.0000 mg | ORAL_TABLET | Freq: Every day | ORAL | Status: DC
Start: 2014-05-04 — End: 2014-05-05
  Administered 2014-05-04: 5 mg via ORAL
  Filled 2014-05-03 (×3): qty 1

## 2014-05-03 MED ORDER — METHOCARBAMOL 500 MG PO TABS: 500.0000 mg | ORAL_TABLET | Freq: Four times a day (QID) | ORAL | Status: DC | PRN

## 2014-05-03 MED ORDER — HYDROMORPHONE HCL 1 MG/ML IJ SOLN
INTRAMUSCULAR | Status: AC
  Filled 2014-05-03: qty 1

## 2014-05-03 MED ORDER — PHENYLEPHRINE 40 MCG/ML (10ML) SYRINGE FOR IV PUSH (FOR BLOOD PRESSURE SUPPORT)
PREFILLED_SYRINGE | INTRAVENOUS | Status: AC
  Filled 2014-05-03: qty 20

## 2014-05-03 MED ORDER — SODIUM CHLORIDE 0.9 % IV SOLN: 250.0000 mL | INTRAVENOUS | Status: DC

## 2014-05-03 MED ORDER — THROMBIN 20000 UNITS EX KIT
PACK | CUTANEOUS | Status: DC | PRN
  Administered 2014-05-03: 20 mL via TOPICAL

## 2014-05-03 MED ORDER — ONDANSETRON HCL 4 MG/2ML IJ SOLN
INTRAMUSCULAR | Status: AC
  Filled 2014-05-03: qty 2

## 2014-05-03 MED ORDER — GABAPENTIN 100 MG PO CAPS
200.0000 mg | ORAL_CAPSULE | Freq: Two times a day (BID) | ORAL | Status: DC
  Administered 2014-05-03 – 2014-05-04 (×3): 200 mg via ORAL
  Filled 2014-05-03 (×3): qty 2

## 2014-05-03 MED ORDER — ONDANSETRON HCL 4 MG/2ML IJ SOLN
INTRAMUSCULAR | Status: DC | PRN
  Administered 2014-05-03: 4 mg via INTRAVENOUS

## 2014-05-03 MED ORDER — GLYCOPYRROLATE 0.2 MG/ML IJ SOLN
INTRAMUSCULAR | Status: AC
  Filled 2014-05-03: qty 4

## 2014-05-03 MED ORDER — LISINOPRIL 20 MG PO TABS
20.0000 mg | ORAL_TABLET | Freq: Every day | ORAL | Status: DC
  Administered 2014-05-03: 20 mg via ORAL
  Filled 2014-05-03 (×2): qty 1

## 2014-05-03 MED ORDER — LORATADINE 10 MG PO TABS
10.0000 mg | ORAL_TABLET | Freq: Every day | ORAL | Status: DC
  Administered 2014-05-03 – 2014-05-04 (×2): 10 mg via ORAL
  Filled 2014-05-03 (×2): qty 1

## 2014-05-03 MED ORDER — HYDROMORPHONE HCL 1 MG/ML IJ SOLN
1.0000 mg | INTRAMUSCULAR | Status: DC | PRN
  Administered 2014-05-03: 1 mg via INTRAVENOUS
  Filled 2014-05-03: qty 1

## 2014-05-03 MED ORDER — LIRAGLUTIDE 18 MG/3ML ~~LOC~~ SOPN: 1.8000 mL | PEN_INJECTOR | Freq: Every day | SUBCUTANEOUS | Status: DC

## 2014-05-03 MED ORDER — PHENYLEPHRINE HCL 10 MG/ML IJ SOLN
10.0000 mg | INTRAVENOUS | Status: DC | PRN
  Administered 2014-05-03: 30 ug/min via INTRAVENOUS

## 2014-05-03 MED ORDER — METHOCARBAMOL 500 MG PO TABS: 500.0000 mg | ORAL_TABLET | Freq: Four times a day (QID) | ORAL | Status: AC | PRN

## 2014-05-03 MED ORDER — ALLOPURINOL 100 MG PO TABS
100.0000 mg | ORAL_TABLET | Freq: Every day | ORAL | Status: DC
Start: 2014-05-04 — End: 2014-05-05
  Administered 2014-05-04: 100 mg via ORAL
  Filled 2014-05-03: qty 1

## 2014-05-03 MED ORDER — OXYCODONE-ACETAMINOPHEN 7.5-325 MG PO TABS: 1.0000 | ORAL_TABLET | Freq: Four times a day (QID) | ORAL | Status: AC | PRN

## 2014-05-03 MED ORDER — THROMBIN 20000 UNITS EX SOLR
CUTANEOUS | Status: AC
  Filled 2014-05-03: qty 20000

## 2014-05-03 MED ORDER — ALBUTEROL SULFATE HFA 108 (90 BASE) MCG/ACT IN AERS
INHALATION_SPRAY | RESPIRATORY_TRACT | Status: DC | PRN
  Administered 2014-05-03: 2 via RESPIRATORY_TRACT

## 2014-05-03 MED ORDER — NEOSTIGMINE METHYLSULFATE 10 MG/10ML IV SOLN
INTRAVENOUS | Status: AC
  Filled 2014-05-03: qty 2

## 2014-05-03 MED ORDER — OXYCODONE-ACETAMINOPHEN 5-325 MG PO TABS
1.0000 | ORAL_TABLET | ORAL | Status: DC | PRN
Start: 2014-05-03 — End: 2014-05-04
  Administered 2014-05-03 (×2): 1 via ORAL
  Administered 2014-05-04 (×2): 2 via ORAL
  Filled 2014-05-03: qty 1
  Filled 2014-05-03 (×2): qty 2

## 2014-05-03 MED ORDER — NEOSTIGMINE METHYLSULFATE 10 MG/10ML IV SOLN
INTRAVENOUS | Status: DC | PRN
  Administered 2014-05-03: 5 mg via INTRAVENOUS

## 2014-05-03 MED ORDER — EPHEDRINE SULFATE 50 MG/ML IJ SOLN
INTRAMUSCULAR | Status: AC
  Filled 2014-05-03: qty 2

## 2014-05-03 MED ORDER — PHENYLEPHRINE HCL 10 MG/ML IJ SOLN
INTRAMUSCULAR | Status: DC | PRN
  Administered 2014-05-03: 120 ug via INTRAVENOUS
  Administered 2014-05-03: 160 ug via INTRAVENOUS
  Administered 2014-05-03: 80 ug via INTRAVENOUS
  Administered 2014-05-03: 120 ug via INTRAVENOUS
  Administered 2014-05-03 (×3): 80 ug via INTRAVENOUS

## 2014-05-03 MED ORDER — METHOCARBAMOL 500 MG PO TABS
500.0000 mg | ORAL_TABLET | Freq: Four times a day (QID) | ORAL | Status: DC | PRN
  Administered 2014-05-03 – 2014-05-04 (×2): 500 mg via ORAL
  Filled 2014-05-03 (×3): qty 1

## 2014-05-03 MED ORDER — PROPOFOL 10 MG/ML IV BOLUS
INTRAVENOUS | Status: DC | PRN
  Administered 2014-05-03: 200 mg via INTRAVENOUS

## 2014-05-03 MED ORDER — HYDROCHLOROTHIAZIDE 25 MG PO TABS
25.0000 mg | ORAL_TABLET | Freq: Every day | ORAL | Status: DC
  Administered 2014-05-03: 25 mg via ORAL
  Filled 2014-05-03: qty 1

## 2014-05-03 MED ORDER — ROCURONIUM BROMIDE 50 MG/5ML IV SOLN
INTRAVENOUS | Status: AC
  Filled 2014-05-03: qty 1

## 2014-05-03 MED ORDER — MENTHOL 3 MG MT LOZG: 1.0000 | LOZENGE | OROMUCOSAL | Status: DC | PRN

## 2014-05-03 MED ORDER — METHOCARBAMOL 1000 MG/10ML IJ SOLN
500.0000 mg | Freq: Four times a day (QID) | INTRAVENOUS | Status: DC | PRN
  Administered 2014-05-03: 500 mg via INTRAVENOUS
  Filled 2014-05-03 (×2): qty 5

## 2014-05-03 MED ORDER — INSULIN ASPART 100 UNIT/ML ~~LOC~~ SOLN
0.0000 [IU] | Freq: Three times a day (TID) | SUBCUTANEOUS | Status: DC
  Administered 2014-05-03 – 2014-05-04 (×4): 7 [IU] via SUBCUTANEOUS
  Filled 2014-05-03 (×25): qty 0.2

## 2014-05-03 MED ORDER — SODIUM CHLORIDE 0.9 % IV SOLN
INTRAVENOUS | Status: DC
  Administered 2014-05-03: 09:00:00 via INTRAVENOUS

## 2014-05-03 MED ORDER — HYDROMORPHONE HCL 1 MG/ML IJ SOLN
0.2500 mg | INTRAMUSCULAR | Status: DC | PRN
  Administered 2014-05-03: 0.25 mg via INTRAVENOUS
  Administered 2014-05-03: 0.5 mg via INTRAVENOUS
  Administered 2014-05-03: 0.25 mg via INTRAVENOUS

## 2014-05-03 MED ORDER — ONDANSETRON HCL 4 MG/2ML IJ SOLN: 4.0000 mg | Freq: Once | INTRAMUSCULAR | Status: DC | PRN

## 2014-05-03 MED ORDER — PROPOFOL 10 MG/ML IV BOLUS
INTRAVENOUS | Status: AC
  Filled 2014-05-03: qty 20

## 2014-05-03 MED ORDER — SUCCINYLCHOLINE CHLORIDE 20 MG/ML IJ SOLN
INTRAMUSCULAR | Status: DC | PRN
  Administered 2014-05-03: 120 mg via INTRAVENOUS

## 2014-05-03 MED ORDER — BUPIVACAINE HCL (PF) 0.25 % IJ SOLN
INTRAMUSCULAR | Status: DC | PRN
  Administered 2014-05-03: 10 mL

## 2014-05-03 MED ORDER — MIDAZOLAM HCL 2 MG/2ML IJ SOLN
INTRAMUSCULAR | Status: AC
  Filled 2014-05-03: qty 2

## 2014-05-03 MED ORDER — PHENOL 1.4 % MT LIQD: 1.0000 | OROMUCOSAL | Status: DC | PRN

## 2014-05-03 MED ORDER — LIDOCAINE HCL (CARDIAC) 20 MG/ML IV SOLN
INTRAVENOUS | Status: AC
  Filled 2014-05-03: qty 5

## 2014-05-03 MED ORDER — FENTANYL CITRATE 0.05 MG/ML IJ SOLN
INTRAMUSCULAR | Status: AC
  Filled 2014-05-03: qty 5

## 2014-05-03 MED ORDER — SUCCINYLCHOLINE CHLORIDE 20 MG/ML IJ SOLN
INTRAMUSCULAR | Status: AC
  Filled 2014-05-03: qty 2

## 2014-05-03 MED ORDER — SODIUM CHLORIDE 0.9 % IV SOLN
INTRAVENOUS | Status: DC | PRN
  Administered 2014-05-03: 10:00:00 via INTRAVENOUS

## 2014-05-03 MED ORDER — OXYCODONE-ACETAMINOPHEN 7.5-325 MG PO TABS: 1.0000 | ORAL_TABLET | Freq: Four times a day (QID) | ORAL | Status: DC | PRN

## 2014-05-03 MED ORDER — LACTATED RINGERS IV SOLN
INTRAVENOUS | Status: DC | PRN
  Administered 2014-05-03 (×2): via INTRAVENOUS

## 2014-05-03 MED ORDER — ROCURONIUM BROMIDE 100 MG/10ML IV SOLN
INTRAVENOUS | Status: DC | PRN
  Administered 2014-05-03: 50 mg via INTRAVENOUS

## 2014-05-03 MED ORDER — MIDAZOLAM HCL 5 MG/5ML IJ SOLN
INTRAMUSCULAR | Status: DC | PRN
  Administered 2014-05-03: 2 mg via INTRAVENOUS

## 2014-05-03 MED ORDER — SODIUM CHLORIDE 0.9 % IJ SOLN: 3.0000 mL | Freq: Two times a day (BID) | INTRAMUSCULAR | Status: DC

## 2014-05-03 MED ORDER — SODIUM CHLORIDE 0.9 % IJ SOLN: 3.0000 mL | INTRAMUSCULAR | Status: DC | PRN

## 2014-05-03 SURGICAL SUPPLY — 48 items
BENZOIN TINCTURE PRP APPL 2/3 (GAUZE/BANDAGES/DRESSINGS) ×3 IMPLANT
BUR ROUND FLUTED 4 SOFT TCH (BURR) ×2 IMPLANT
BUR ROUND FLUTED 4MM SOFT TCH (BURR) ×1
CLOSURE STERI-STRIP 1/2X4 (GAUZE/BANDAGES/DRESSINGS) ×1
CLSR STERI-STRIP ANTIMIC 1/2X4 (GAUZE/BANDAGES/DRESSINGS) ×2 IMPLANT
COVER SURGICAL LIGHT HANDLE (MISCELLANEOUS) ×3 IMPLANT
DERMABOND ADVANCED (GAUZE/BANDAGES/DRESSINGS) ×2
DERMABOND ADVANCED .7 DNX12 (GAUZE/BANDAGES/DRESSINGS) ×1 IMPLANT
DRAPE MICROSCOPE LEICA (MISCELLANEOUS) ×6 IMPLANT
DRAPE PROXIMA HALF (DRAPES) ×6 IMPLANT
DRSG MEPILEX BORDER 4X4 (GAUZE/BANDAGES/DRESSINGS) ×3 IMPLANT
DRSG MEPILEX BORDER 4X8 (GAUZE/BANDAGES/DRESSINGS) ×3 IMPLANT
DURAPREP 26ML APPLICATOR (WOUND CARE) ×3 IMPLANT
ELECT BLADE 4.0 EZ CLEAN MEGAD (MISCELLANEOUS) ×3
ELECT REM PT RETURN 9FT ADLT (ELECTROSURGICAL) ×3
ELECTRODE BLDE 4.0 EZ CLN MEGD (MISCELLANEOUS) ×1 IMPLANT
ELECTRODE REM PT RTRN 9FT ADLT (ELECTROSURGICAL) ×1 IMPLANT
GLOVE BIOGEL PI IND STRL 8 (GLOVE) ×2 IMPLANT
GLOVE BIOGEL PI INDICATOR 8 (GLOVE) ×4
GLOVE ORTHO TXT STRL SZ7.5 (GLOVE) ×6 IMPLANT
GOWN STRL REUS W/ TWL LRG LVL3 (GOWN DISPOSABLE) ×2 IMPLANT
GOWN STRL REUS W/ TWL XL LVL3 (GOWN DISPOSABLE) ×1 IMPLANT
GOWN STRL REUS W/TWL 2XL LVL3 (GOWN DISPOSABLE) ×3 IMPLANT
GOWN STRL REUS W/TWL LRG LVL3 (GOWN DISPOSABLE) ×4
GOWN STRL REUS W/TWL XL LVL3 (GOWN DISPOSABLE) ×2
KIT BASIN OR (CUSTOM PROCEDURE TRAY) ×3 IMPLANT
KIT ROOM TURNOVER OR (KITS) ×3 IMPLANT
MANIFOLD NEPTUNE II (INSTRUMENTS) ×3 IMPLANT
NEEDLE HYPO 25GX1X1/2 BEV (NEEDLE) ×3 IMPLANT
NEEDLE SPNL 18GX3.5 QUINCKE PK (NEEDLE) ×3 IMPLANT
NS IRRIG 1000ML POUR BTL (IV SOLUTION) ×3 IMPLANT
PACK LAMINECTOMY ORTHO (CUSTOM PROCEDURE TRAY) ×3 IMPLANT
PAD ARMBOARD 7.5X6 YLW CONV (MISCELLANEOUS) ×18 IMPLANT
PATTIES SURGICAL .5 X.5 (GAUZE/BANDAGES/DRESSINGS) IMPLANT
PATTIES SURGICAL .75X.75 (GAUZE/BANDAGES/DRESSINGS) IMPLANT
SPONGE SURGIFOAM ABS GEL 100 (HEMOSTASIS) ×3 IMPLANT
SURGIFLO W/THROMBIN 8M KIT (HEMOSTASIS) ×3 IMPLANT
SUT BONE WAX W31G (SUTURE) IMPLANT
SUT VIC AB 0 CT1 27 (SUTURE) ×2
SUT VIC AB 0 CT1 27XBRD ANBCTR (SUTURE) ×1 IMPLANT
SUT VIC AB 2-0 CT1 27 (SUTURE) ×2
SUT VIC AB 2-0 CT1 TAPERPNT 27 (SUTURE) ×1 IMPLANT
SUT VIC AB 3-0 X1 27 (SUTURE) IMPLANT
SYR BULB IRRIGATION 50ML (SYRINGE) ×3 IMPLANT
TOWEL OR 17X24 6PK STRL BLUE (TOWEL DISPOSABLE) ×3 IMPLANT
TOWEL OR 17X26 10 PK STRL BLUE (TOWEL DISPOSABLE) ×3 IMPLANT
WATER STERILE IRR 1000ML POUR (IV SOLUTION) IMPLANT
YANKAUER SUCT BULB TIP NO VENT (SUCTIONS) ×3 IMPLANT

## 2014-05-03 NOTE — Anesthesia Postprocedure Evaluation (Signed)
Anesthesia Post Note  Patient: [name] [name]  Procedure(s) Performed: Procedure(s) (LRB): L3-4 Decompression (N/A)  Anesthesia type: general  Patient location: PACU  Post pain: Pain level controlled  Post assessment: Patient's Cardiovascular Status Stable  Last Vitals:  Filed Vitals:   05/01/2014 1629  BP: 107/59  Pulse: 100  Temp: 36.9 C  Resp: 20    Post vital signs: Reviewed and stable  Level of consciousness: sedated  Complications: No apparent anesthesia complications

## 2014-05-03 NOTE — Anesthesia Preprocedure Evaluation (Signed)
Anesthesia Evaluation  Patient identified by MRN, date of birth, ID band Patient awake    Reviewed: Allergy & Precautions, NPO status , Patient's Chart, lab work & pertinent test results  History of Anesthesia Complications (+) PONV  Airway        Dental   Pulmonary asthma , COPDformer smoker,          Cardiovascular hypertension,     Neuro/Psych Depression    GI/Hepatic GERD-  ,(+) Hepatitis -  Endo/Other  diabetes, Type 2, Oral Hypoglycemic AgentsMorbid obesity  Renal/GU Renal InsufficiencyRenal disease     Musculoskeletal  (+) Arthritis -,   Abdominal   Peds  Hematology   Anesthesia Other Findings   Reproductive/Obstetrics                             Anesthesia Physical Anesthesia Plan  ASA: III  Anesthesia Plan: General   Post-op Pain Management:    Induction: Intravenous  Airway Management Planned: Oral ETT  Additional Equipment:   Intra-op Plan:   Post-operative Plan: Extubation in OR  Informed Consent: I have reviewed the patients History and Physical, chart, labs and discussed the procedure including the risks, benefits and alternatives for the proposed anesthesia with the patient or authorized representative who has indicated his/her understanding and acceptance.     Plan Discussed with: CRNA, Anesthesiologist and Surgeon  Anesthesia Plan Comments:         Anesthesia Quick Evaluation

## 2014-05-03 NOTE — Transfer of Care (Signed)
Immediate Anesthesia Transfer of Care Note  Patient: [name] FredericksonJoyce H [name]  Procedure(s) Performed: Procedure(s): L3-4 Decompression (N/A)  Patient Location: PACU  Anesthesia Type:General  Level of Consciousness: awake, alert  and oriented  Airway & Oxygen Therapy: Patient Spontanous Breathing and Patient connected to face mask oxygen  Post-op Assessment: Report given to RN and Post -op Vital signs reviewed and stable  Post vital signs: Reviewed and stable  Last Vitals:  Filed Vitals:   2014/06/23 0821  BP: 150/58  Pulse: 100  Temp: 36.6 C  Resp: 18    Complications: No apparent anesthesia complications

## 2014-05-03 NOTE — Interval H&P Note (Signed)
History and Physical Interval Note:  04/30/2014 9:20 AM  [name] [name]  has presented today for surgery, with the diagnosis of L3-4 Stenosis  The various methods of treatment have been discussed with the patient and family. After consideration of risks, benefits and other options for treatment, the patient has consented to  Procedure(s): L3-4 Decompression (N/A) as a surgical intervention .  The patient's history has been reviewed, patient examined, no change in status, stable for surgery.  I have reviewed the patient's chart and labs.  Questions were answered to the patient's satisfaction.     Gustaf Mccarter C

## 2014-05-04 DIAGNOSIS — M5126 Other intervertebral disc displacement, lumbar region: Secondary | ICD-10-CM | POA: Diagnosis not present

## 2014-05-04 LAB — CBC
HEMATOCRIT: 33.6 % — AB (ref 36.0–46.0)
Hemoglobin: 10.3 g/dL — ABNORMAL LOW (ref 12.0–15.0)
MCH: 29.7 pg (ref 26.0–34.0)
MCHC: 30.7 g/dL (ref 30.0–36.0)
MCV: 96.8 fL (ref 78.0–100.0)
Platelets: 305 10*3/uL (ref 150–400)
RBC: 3.47 MIL/uL — AB (ref 3.87–5.11)
RDW: 14.8 % (ref 11.5–15.5)
WBC: 13.4 10*3/uL — ABNORMAL HIGH (ref 4.0–10.5)

## 2014-05-04 LAB — GLUCOSE, CAPILLARY
GLUCOSE-CAPILLARY: 245 mg/dL — AB (ref 70–99)
Glucose-Capillary: 205 mg/dL — ABNORMAL HIGH (ref 70–99)
Glucose-Capillary: 219 mg/dL — ABNORMAL HIGH (ref 70–99)
Glucose-Capillary: 131 mg/dL — ABNORMAL HIGH (ref 70–99)

## 2014-05-04 MED ORDER — HYDROCODONE-ACETAMINOPHEN 5-325 MG PO TABS
1.0000 | ORAL_TABLET | ORAL | Status: DC | PRN
  Administered 2014-05-04: 2 via ORAL
  Filled 2014-05-04: qty 2

## 2014-05-04 MED ORDER — HYDROCHLOROTHIAZIDE 25 MG PO TABS: 25.0000 mg | ORAL_TABLET | Freq: Every day | ORAL | Status: DC

## 2014-05-04 MED ORDER — LISINOPRIL 20 MG PO TABS: 20.0000 mg | ORAL_TABLET | Freq: Every day | ORAL | Status: DC

## 2014-05-04 MED ORDER — SODIUM CHLORIDE 0.9 % IV BOLUS (SEPSIS)
500.0000 mL | Freq: Once | INTRAVENOUS | Status: AC
  Administered 2014-05-04: 500 mL via INTRAVENOUS

## 2014-05-04 NOTE — Progress Notes (Signed)
Subjective: 1 Day Post-Op Procedure(s) (LRB): L3-4 Decompression (N/A) Patient reports pain as moderate. Actively vomiting this AM.  Nose bleed.   Objective: Vital signs in last 24 hours: Temp:  [98.5 F (36.9 C)-100.1 F (37.8 C)] 99.8 F (37.7 C) (02/27 0405) Pulse Rate:  [75-106] 75 (02/27 0405) Resp:  [4-25] 18 (02/27 0405) BP: (89-119)/(45-68) 94/45 mmHg (02/27 0405) SpO2:  [88 %-100 %] 98 % (02/27 0405) Weight:  [122.97 kg (271 lb 1.6 oz)] 122.97 kg (271 lb 1.6 oz) (02/27 0405)  Intake/Output from previous day: 02/26 0701 - 02/27 0700 In: 3080 [P.O.:800; I.V.:2225; IV Piggyback:55] Out: 26 [Stool:1; Blood:25] Intake/Output this shift: Total I/O In: 320 [P.O.:320] Out: -   No results for input(s): HGB in the last 72 hours. No results for input(s): WBC, RBC, HCT, PLT in the last 72 hours. No results for input(s): NA, K, CL, CO2, BUN, CREATININE, GLUCOSE, CALCIUM in the last 72 hours. No results for input(s): LABPT, INR in the last 72 hours.  Sensation intact distally Intact pulses distally Incision: moderate drainage Compartment soft  Able to wiggle toes  Assessment/Plan: 1 Day Post-Op Procedure(s) (LRB): L3-4 Decompression (N/A) Up with therapy  Will hold discharge today as patient lives alone is actively vomiting and hypotensive. Dressing changed due moderate bleeding / drainage . No active bleed. D/c  Percocet most likely source of drowsiness and hypotension , start norco for pain .  Check CBC Teckla Christiansen 05/04/2014, 9:44 AM

## 2014-05-04 NOTE — Op Note (Signed)
[name]:  [name], [name]                 ACCOUNT NO.:  [id number]  MEDICAL RECORD NO.:  [id number]  LOCATION:  5N01C                        FACILITY:  [facility]  PHYSICIAN:  [name], M.D.    DATE OF BIRTH:  14-Feb-1956  DATE OF PROCEDURE: DATE OF DISCHARGE:                              OPERATIVE REPORT   PREOPERATIVE DIAGNOSES:  Bilateral lateral recess stenosis, central stenosis, L3-4 with right L3-4 herniated nucleus pulposus.  Previous decompression L4-5.  POSTOPERATIVE DIAGNOSES:  Bilateral lateral recess stenosis, central stenosis, L3-4 with right L3-4 herniated nucleus pulposus.  Previous decompression L4-5.  PROCEDURE:  Complete L3 laminectomy.  Bilateral L4 laminotomy with removal of reform lamina remnant.  Right L3-4 microdiskectomy.  SURGEON:  [name], M.D.  ASSISTANT:  Zonia KiefJames Owens, PA-C medically necessary and present for the entire procedure.  EBL:  200 mL.  DRAINS:  None.  A 59 year old female, previous L4-5 decompression, has had progressive neurogenic claudication with spinal stenosis and HNP on the right at L3- 4 with cephalad migration.  She had failed conservative treatment and progressive symptoms, pain with standing, pain with ambulating.  DESCRIPTION OF PROCEDURE:  After induction of general anesthesia, Ancef prophylaxis, patient had a history of positive PCR in the past but repeat tests showed Staph.  Ancef was given prophylactically.  The patient was placed in prone with chest rolls.  Standard prepping and draping, foam pads underneath the elbows and rolled up anterior to the shoulders.  The patient was large with 114 kg, short stature.  Back was prepped.  Old incision was outlined after DuraPrep, area squared with towels, Betadine, Steri-Drape, applied laminectomy sheets and drapes. Midline incision was made extending cephalad based on spinal needle placement and cross-table lateral x-ray.  Maximum exposure was required for visualization and once  the lamina was exposed, soft tissue was stripped off the lamina out to the facets.  Kocher clamps were placed. Suggested repeat x-ray taken for the planned level of decompression.  5- 1 disk space showed some narrowing but with some contrast enhancement, I was able to be visualized on the fluoroscopic screen after the cross- table lateral radiograph.  The L3 spinous process, L3 lamina was removed.  It was thinned with 4-mm bur, removed with Kerrison, with patties used to protect the dura using the operative microscope.  Thick chunks of ligament were peeled from proximal to distal which was causing the stenosis primarily.  There was overhanging facets both right and left that had to be trimmed back.  There was extensive scar tissue distally and some top portion of the lamina had reformed at L4 with some of the facet joint extending and this had to be trimmed back to round up the dura back into the normal around tube.  On the right side, there were multiple large veins present overlying the disk which showed aseptically migrated out from the disk space, it was in a pocket.  Veins were coagulated.  Thrombin and Gelfoam were used.  There is still some bleeding.  On the opposite left side, there is no disk compressing. Small pocket was opened and chunks of disks were removed with black nerve hook, micropituitary, and sucked up  with suction.  Passes were made in the disk at the midline.  Epstein curette used up and down micropituitary.  Foramina was enlarged.  There were no areas of compression.  The lateral gutter had thrombin-soaked Gelfoam placed with patties.  Some Surgicel foam was mixed and placed in the gutters.  All thrombin patties had been removed and all half by half patties were removed.  Operative field was dry.  Standard closure in the deep fascia, 2-0 Vicryl in the subcutaneous tissue, fat layers which was multiple inches thick.  The longus plates on the McCullough  self-retaining retractor had been used and skin was closed with a subcuticular closure with Dermabond on the skin.  The patient tolerated the procedure well, was transferred to the recovery room in stable condition.  Instrument count and needle count were correct.     Hagop Mccollam C. Ophelia Charter, M.D.     MCY/MEDQ  D:  2014-06-01  T:  05/04/2014  Job:  [id number]

## 2014-05-04 NOTE — Progress Notes (Signed)
UR completed 

## 2014-05-05 ENCOUNTER — Observation Stay (HOSPITAL_COMMUNITY): Payer: Medicare Other | Admitting: Anesthesiology

## 2014-05-05 DIAGNOSIS — M5126 Other intervertebral disc displacement, lumbar region: Secondary | ICD-10-CM | POA: Diagnosis not present

## 2014-05-05 LAB — GLUCOSE, CAPILLARY: Glucose-Capillary: 217 mg/dL — ABNORMAL HIGH (ref 70–99)

## 2014-05-05 MED ORDER — EPINEPHRINE HCL 0.1 MG/ML IJ SOSY
PREFILLED_SYRINGE | INTRAMUSCULAR | Status: AC
  Filled 2014-05-05: qty 20

## 2014-05-05 MED ORDER — NALOXONE HCL 0.4 MG/ML IJ SOLN: 0.4000 mg | Freq: Once | INTRAMUSCULAR | Status: DC

## 2014-05-05 MED ORDER — SODIUM BICARBONATE 8.4 % IV SOLN
INTRAVENOUS | Status: AC
  Filled 2014-05-05: qty 100

## 2014-05-05 MED FILL — Medication: Qty: 1 | Status: AC

## 2014-05-06 ENCOUNTER — Encounter (HOSPITAL_COMMUNITY): Payer: Self-pay | Admitting: Orthopaedic Surgery

## 2014-05-06 MED FILL — Thrombin For Soln 20000 Unit: CUTANEOUS | Qty: 1 | Status: AC

## 2014-05-07 NOTE — Progress Notes (Signed)
@  0215: RN checked the patient and was sleeping on her left side. @ 0300, NT checked the pt and was sleeping on her back. @0400 , NT came back to check pt's v/s and found her cyanotic. Rapid response team called and a code blue was called and started resuscitating the pt @0420 . @0445 , Hospitalist pronounced pt expired after 27 minutes of resuscitation attempt. There was no shockable rhythm and the patient remained asystolic. @0455 , RN called the son and notified about the event. @0500 , Dr. Magnus IvanBlackman came in and spoke to the son. Son was not in the process to talk about things and agreed to call him back later.

## 2014-05-07 NOTE — Progress Notes (Signed)
Patient ID: [name], female   DOB: 12/18/1955, 59 y.o.   MRN: [id number] The Rapid Response Team was called this am sometime after 0400 for Ms. [name] [name] and a Code Blue was called.  She was found asystolic and attempts were made by the code Team to resuscitate her.  She apparently remained asystolic and resuscitation was unsucessfull.  She expired at around 0445. I spoke to Nursing who had checked on her during the night.  Earlier in the evening they were able to help her get up and ambulate to the bathroom and then get back in bed.  Her last check recorded on her flow sheetat 0405 found her asleep with a SBP of 94 and sats of 98% on nasal cannula.  She was checked on sometime after that and found to be unresponsive and the Code Team was called.  Earlier in the day yesterday she was hypotensive and all BP meds were held and her pain meds reduced.  She was also complaining of nausea.  An IV bolus was ordered as well as labs.  Her hgb came back at 10 and she seemed to respond to the bolus.  There were no other issues reported after that.  Her past medical history list diabetes and hypertension.  She also weighs 270 lbs.  I did call her son at home and speak with him about the events and that she had passed away.  I also informed Dr. Ophelia CharterYates of this.

## 2014-05-07 NOTE — Code Documentation (Signed)
  Patient Name: [name] [name]   MRN: [id number]   Date of Birth/ Sex: August 08, 1955 , female      Admission Date: May 21, 2014  Attending Provider: Eldred MangesMark C Yates, MD  Primary Diagnosis: L3-4 Stenosis   Indication: Pt was in her usual state of health until this AM, when she was noted to be unresponsive, cyanotic and in asystole. Code blue was subsequently called. At the time of arrival on scene, ACLS protocol was underway.   Technical Description:  - CPR performance duration:  27  minutes  - Was defibrillation or cardioversion used? No - there was never a shockable rhythm   - Was external pacer placed? No  - Was patient intubated pre/post CPR? Yes   Medications Administered: Y = Yes; Blank = No Amiodarone    Atropine    Calcium  1  Epinephrine  7  Lidocaine    Magnesium  1  Norepinephrine    Phenylephrine    Sodium bicarbonate  2  Vasopressin    Other Narcan   Post CPR evaluation:  - Final Status - Was patient successfully resuscitated ? No   Miscellaneous Information:  - Time of death:  4:45  AM  - Primary team notified?  Yes  - Family Notified? Yes  Persistent asystole throughout   Code was run by [name] ReevesSoli Kennerly, MD PGY3    Dionne AnoJulia Youcef Klas, MD   04/26/2014, 4:45 AM

## 2014-05-07 DEATH — deceased

## 2014-05-09 NOTE — Discharge Summary (Signed)
[name]:  [name] [name], [name] [name]                 ACCOUNT NO.:  [id number]  MEDICAL RECORD NO.:  [id number]  LOCATION:  [id number]                        FACILITY:  [facility]  PHYSICIAN:  Mark C. Ophelia CharterYates, M.D.    DATE OF BIRTH:  Aug 08, 1955  DATE OF ADMISSION:  04/17/2014 DATE OF DISCHARGE:  11-Mar-2014                              DISCHARGE SUMMARY/DEATH SUMMARY   FINAL DIAGNOSES: 1. L3-4 stenosis. 2. Obesity. 3. Neurogenic claudication. 4. Hypertension. 5. Previous total knee arthroplasties. 6. Peripheral neuropathy secondary to diabetes. 7. Cardiopulmonary arrest       This 59 year old female has had neurogenic claudication, had previous decompression at the L4-5 level in 2012, four years ago, did well.  She had progressive adjacent stenosis with neurogenic claudication, not able to ambulate significant distances, had to lean over grocery cart, could not stand more than a few minutes .MRI documented stenosis at L3-4 above the previous well decompressed L4-5 leve.   She had significantly increased BMI with a weight of 123 kg/272 pounds at 5 feet tall.  She has been followed by [name] [name] for her medical problems . EKG pre-op was normal and unchanged from 2012. CXR was clear .   MEDICATIONS:  On admission include lisinopril/HCTZ, diclofenac 75 mg b.i.d., Effexor 150 mg daily, glipizide 5 mg daily, Victoza 1 a day injections, Zyrtec 10 mg, imipenem 100 mg 4 a day, vitamin D 1 a week, allopurinol 100 mg, and hydrocodone sparingly.  ALLERGIES:  The patient was allergic to tetracycline.  HOSPITAL COURSE:  The patient was admitted to the hospital after informed consent, underwent decompression at the L3-4 level.  Surgery was as planned, there was minimal blood loss.  The patient had decompression performed.    Postoperatively, she was stable in the recovery room, was transferred to the floor.  She was noted to be slow with ambulation on the day after surgery.  Blood pressure was down 90/60, pulse  remained 90 to 100 which was similar to preoperatively.  Her EKG preoperatively was normal and exactly like it had been in 2012.  The patient denied any chest pain. She was neurologically intact after her procedure .  She was ambulatory to the bathroom with the nurse and on May 05, 2014 in the evening ,fell asleep, checked by nurse early while asleep and vitals good, later  was found by the nurse, asystolic, cold, and blue and a code blue was called.  Attempts at resuscitation were unsuccessful.  Nurse notes documented that in the evening she had been ambulatory to the bathroom, got back in bed.  Last check on the flow sheet was 4:05AM, and she had a systolic blood pressure of 94, sats of 98 on nasal cannula.  Pharmacy review of the patient's postoperative medications showed that she had appropriate pain medication, one Percocet.  When she hit the floor on May 03, 2014, 1 mg of Dilaudid at 1614 hours and then another Percocet at 2135.  The patient had 2 Percocet, had some problems with nausea and vomiting, and p.m. opioid was switched from Percocet to Norco.  She had no further pain medication during the day on May 04, 2014, until 2240 when she had 2  Norco tablets for a pain score of 5. She had a normal low-dose neurontin  200 mg twice a day which she had been on for years.  Presumptive cause of death was acute PE or MI.  The patient was on SCD calf pumpers post op and was using them.  No chemical DVT prophylaxis was used due to her spine surgery.  The patient had no history of DVT, PE, cardiac problems  in the past.   Family requested no  autopsy.  Labs during the code were as expected, glucoses were 131 to 219 after surgery  and preoperatively hemoglobin was 12.8 and postop day 1, it was 10.3 with normal indices.  Dressing was dry.  She had good lower extremity strength and had been ambulatory the day before as well as the night when she coded.  After code was unsuccessful  for an extensive period of time, code was called, the patient was pronounced dead. Dr Magnus Ivan was present and communicated with patient's son.      Mark C. Ophelia Charter, M.D.     MCY/MEDQ  D:  05/08/2014  T:  05/09/2014  Job:  [id number]

## 2016-04-13 ENCOUNTER — Encounter: Payer: Self-pay | Admitting: Internal Medicine

## 2016-11-11 IMAGING — MR MR LUMBAR SPINE WO/W CM
4 of 7 series · 24 of 48 positions shown · IV contrast (20ml Multihance)
Comparison: 01/15/2011

CLINICAL DATA: Low back pain with radicular leg pain and bilateral
foot numbness. Prior lumbar spine surgery in 3483.

BUN and creatinine were obtained on site at [HOSPITAL] at
[HOSPITAL].
Results:  BUN 34 mg/dL,  Creatinine 1 point to mg/dL.
EXAM:
MRI LUMBAR SPINE WITHOUT AND WITH CONTRAST
TECHNIQUE: Multiplanar and multiecho pulse sequences of the lumbar spine were
obtained without and with intravenous contrast.
CONTRAST:  20mL MULTIHANCE GADOBENATE DIMEGLUMINE 529 MG/ML IV SOLN

[Series 3: T1 · sagittal · 4.0mm · 0.55mm/px · 5 of 13 slices shown (1 of 2)]
[im 1/13]
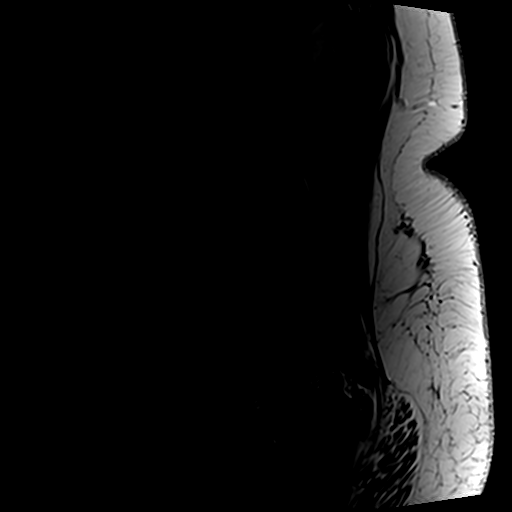
[im 4/13]
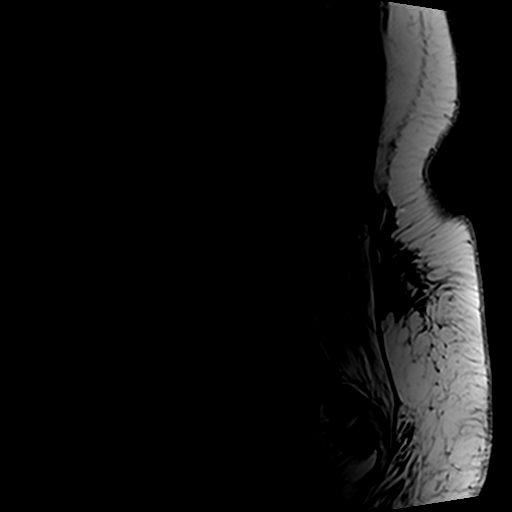
[im 7/13]
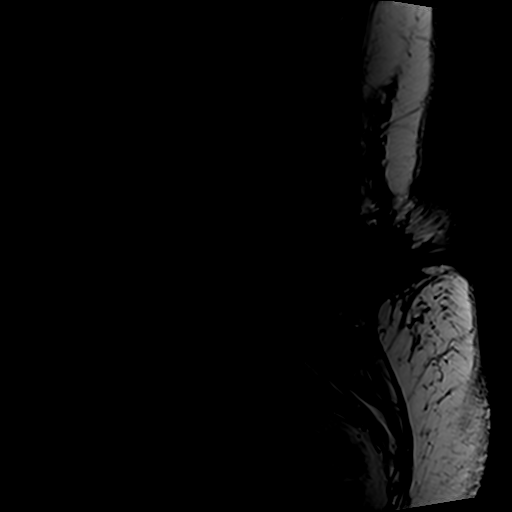
[im 10/13]
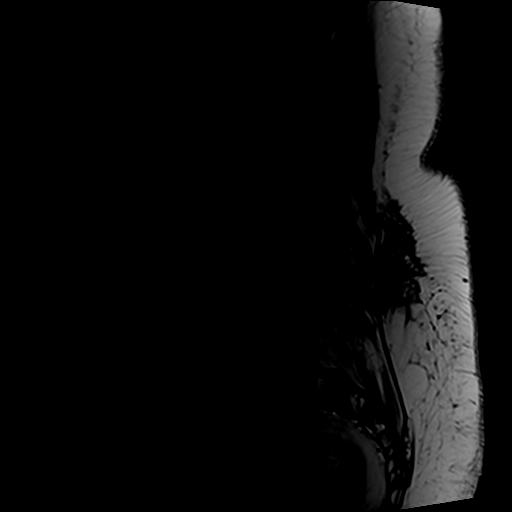
[im 13/13]
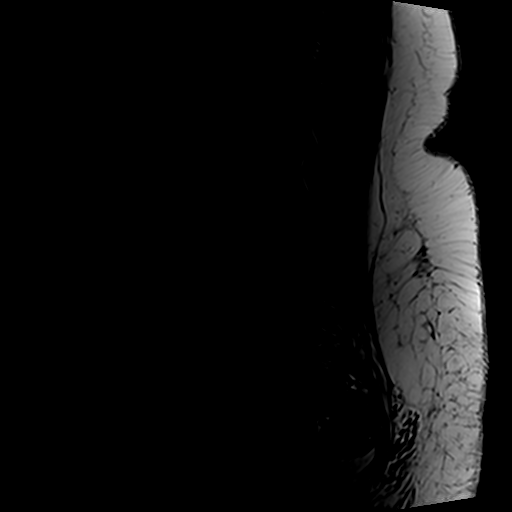

[Series 5: T2 · axial · 4.0mm · 0.70mm/px · z∈[-53,+111]mm · 8 of 31 slices shown]
[im 1/31]
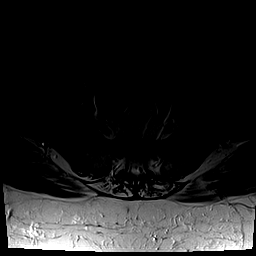
[im 4/31]
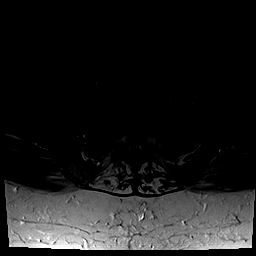
[im 11/31]
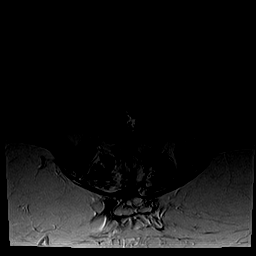
[im 14/31]
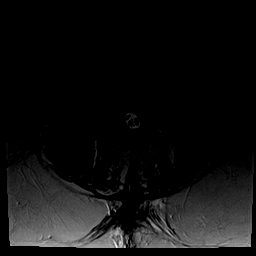
[im 17/31]
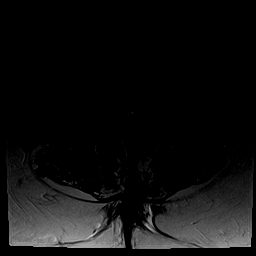
[im 21/31]
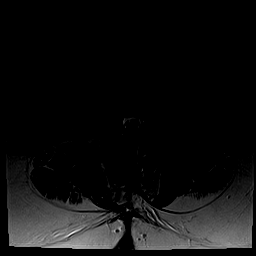
[im 27/31]
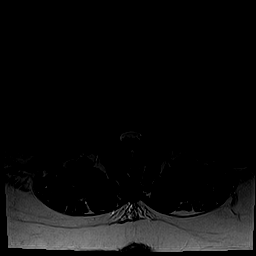
[im 31/31]
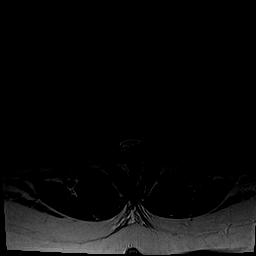

[Series 6: T1 · axial · 4.0mm · 0.35mm/px · z∈[-53,+90]mm · 7 of 31 slices shown (2 of 2)]
[im 1/31]
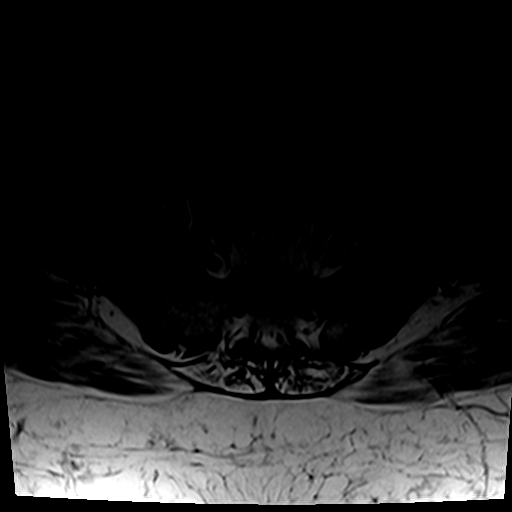
[im 4/31]
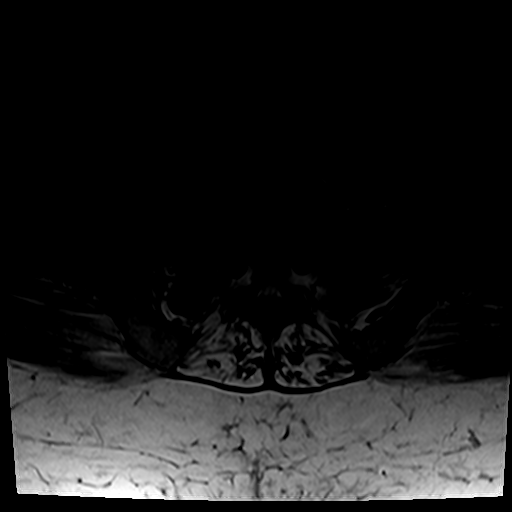
[im 11/31]
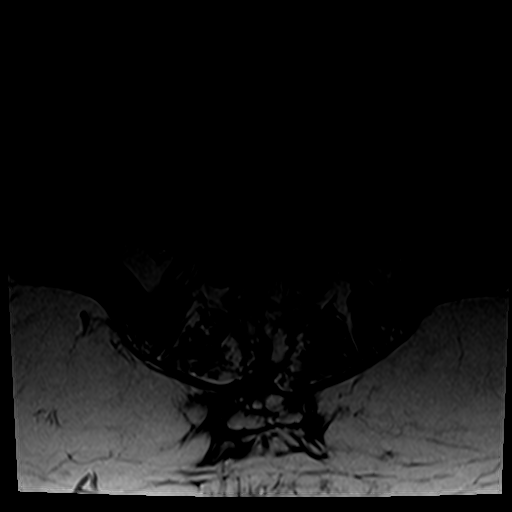
[im 14/31]
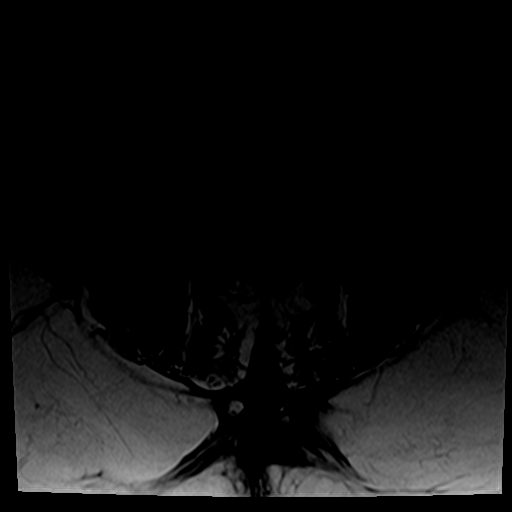
[im 17/31]
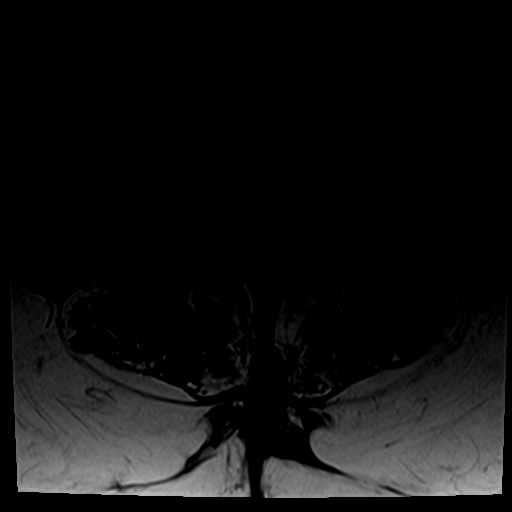
[im 21/31]
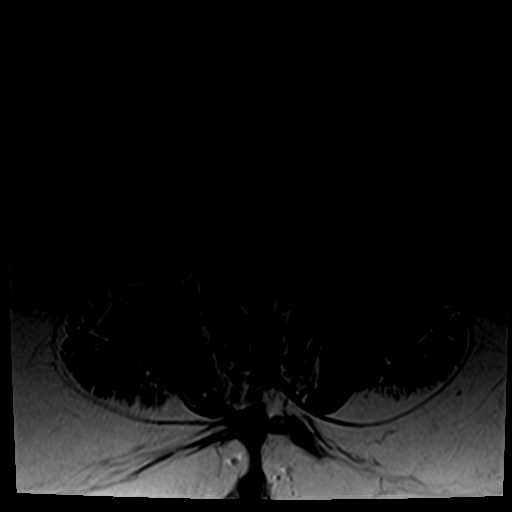
[im 27/31]
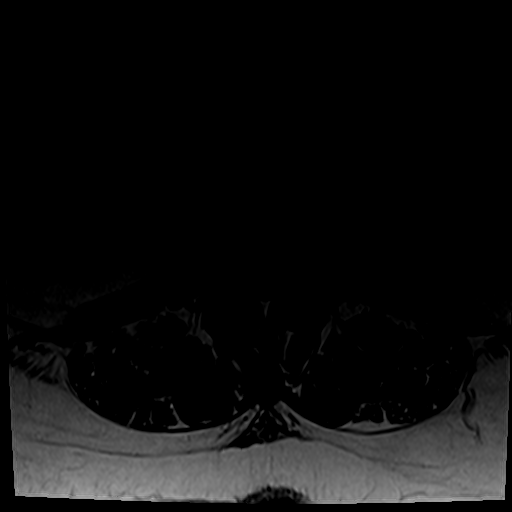

[Series 7: T2 post-contrast · sagittal · 4.0mm · 0.55mm/px · 4 of 13 slices shown]
[im 1/13]
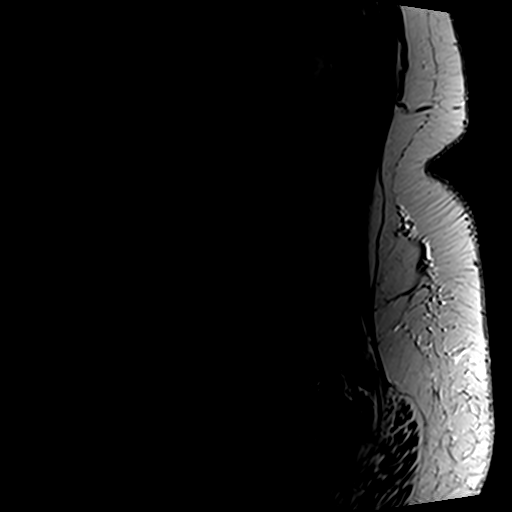
[im 5/13]
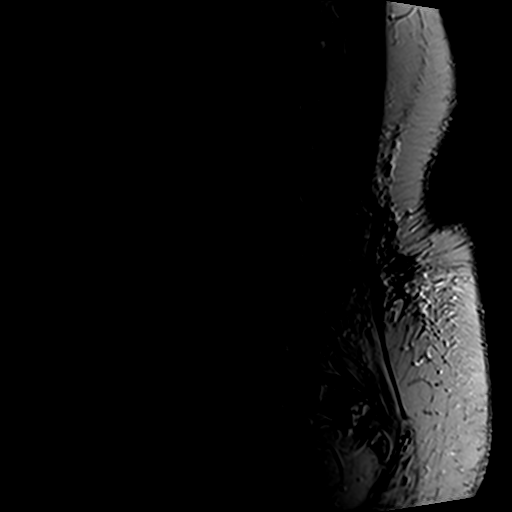
[im 9/13]
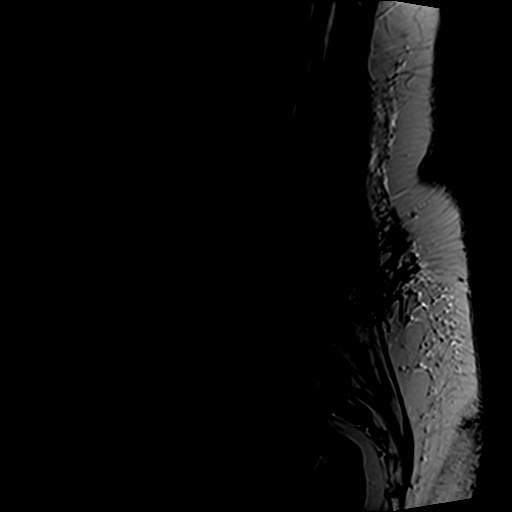
[im 13/13]
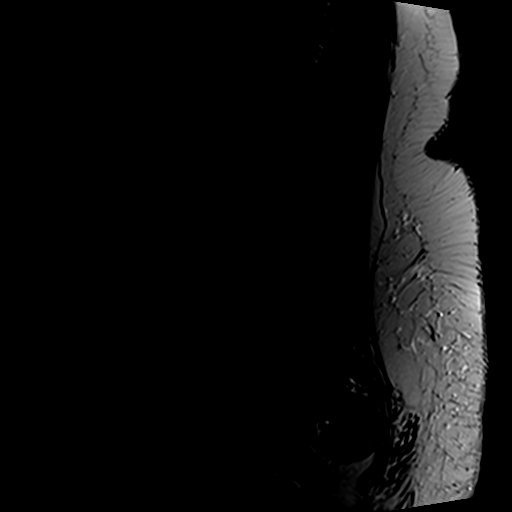

[24 of 48 positions shown; findings below may reference images not displayed]

FINDINGS: Lumbar numbering is continued from the prior examination, with
right-sided hemisacralization of L5 again noted. Vertebral alignment
is within normal limits. Vertebral body heights are preserved. Disc
desiccation is present from L2-3 to L4-5 with associated mild disc
space narrowing at these levels. Minimal degenerative marrow changes
are present in the mid and lower lumbar spine. No vertebral marrow
edema is seen. Posterior midline fluid collection on the prior study
has nearly completely resolved. Conus medullaris is normal in signal
and terminates at T12.

L1-2:  Negative.

L2-3:  New, shallow central disc protrusion without stenosis.

L3-4: Increased circumferential disc bulging, new right central disc
protrusion, and mild-to-moderate facet and ligamentum flavum
hypertrophy result in new right greater the left lateral recess
stenosis, moderate spinal stenosis, and mild right neural foraminal
stenosis.

L4-5: Prior laminectomy is again identified. Mild disc bulging and
facet hypertrophy result in mild bilateral lateral recess narrowing
with mild medial deviation of the descending right L5 nerve root
again noted. Mild residual narrowing of the spinal canal is
unchanged. No neural foraminal stenosis. Mild epidural enhancement
likely reflects postoperative granulation tissue.

L5-S1:  Negative.
IMPRESSION: 1. Progressive disc degeneration at L3-4 resulting in new, moderate
spinal stenosis.
2. Unchanged appearance of the lumbar spine elsewhere.

## 2023-08-24 ENCOUNTER — Telehealth: Payer: Self-pay | Admitting: Cardiology
